# Patient Record
Sex: Male | Born: 1940 | Race: Black or African American | Hispanic: No | Marital: Single | Smoking: Former smoker
Health system: Southern US, Community
[De-identification: ages and names within clinical notes are randomized; demographics above are authoritative.]

## PROBLEM LIST (undated history)

## (undated) DIAGNOSIS — G629 Polyneuropathy, unspecified: Secondary | ICD-10-CM

## (undated) DIAGNOSIS — E119 Type 2 diabetes mellitus without complications: Secondary | ICD-10-CM

## (undated) DIAGNOSIS — K219 Gastro-esophageal reflux disease without esophagitis: Secondary | ICD-10-CM

## (undated) HISTORY — DX: Polyneuropathy, unspecified: G62.9

## (undated) HISTORY — DX: Type 2 diabetes mellitus without complications: E11.9

## (undated) HISTORY — DX: Gastro-esophageal reflux disease without esophagitis: K21.9

---

## 1998-11-21 ENCOUNTER — Emergency Department (HOSPITAL_COMMUNITY): Admission: EM | Admit: 1998-11-21 | Discharge: 1998-11-21 | Payer: Self-pay | Admitting: Emergency Medicine

## 2002-12-14 ENCOUNTER — Encounter: Payer: Self-pay | Admitting: Occupational Medicine

## 2002-12-14 ENCOUNTER — Encounter: Admission: RE | Admit: 2002-12-14 | Discharge: 2002-12-14 | Payer: Self-pay | Admitting: Occupational Medicine

## 2005-02-22 ENCOUNTER — Emergency Department (HOSPITAL_COMMUNITY): Admission: EM | Admit: 2005-02-22 | Discharge: 2005-02-22 | Payer: Self-pay | Admitting: Emergency Medicine

## 2005-09-28 ENCOUNTER — Ambulatory Visit: Payer: Self-pay | Admitting: Family Medicine

## 2005-10-19 ENCOUNTER — Ambulatory Visit: Payer: Self-pay | Admitting: Family Medicine

## 2005-11-12 ENCOUNTER — Ambulatory Visit: Payer: Self-pay | Admitting: Family Medicine

## 2005-12-07 ENCOUNTER — Ambulatory Visit: Payer: Self-pay | Admitting: Family Medicine

## 2005-12-15 ENCOUNTER — Ambulatory Visit: Payer: Self-pay | Admitting: Internal Medicine

## 2006-01-13 ENCOUNTER — Ambulatory Visit: Payer: Self-pay | Admitting: Internal Medicine

## 2007-09-05 DIAGNOSIS — K219 Gastro-esophageal reflux disease without esophagitis: Secondary | ICD-10-CM | POA: Insufficient documentation

## 2007-09-05 DIAGNOSIS — Z8619 Personal history of other infectious and parasitic diseases: Secondary | ICD-10-CM

## 2009-01-23 ENCOUNTER — Ambulatory Visit (HOSPITAL_COMMUNITY): Admission: EM | Admit: 2009-01-23 | Discharge: 2009-01-23 | Payer: Self-pay | Admitting: Emergency Medicine

## 2009-01-23 ENCOUNTER — Ambulatory Visit: Payer: Self-pay | Admitting: Internal Medicine

## 2009-01-25 ENCOUNTER — Telehealth: Payer: Self-pay | Admitting: Internal Medicine

## 2009-01-30 ENCOUNTER — Ambulatory Visit: Payer: Self-pay | Admitting: Internal Medicine

## 2009-02-15 ENCOUNTER — Encounter: Payer: Self-pay | Admitting: Internal Medicine

## 2009-02-15 ENCOUNTER — Ambulatory Visit (HOSPITAL_COMMUNITY): Admission: RE | Admit: 2009-02-15 | Discharge: 2009-02-15 | Payer: Self-pay | Admitting: Internal Medicine

## 2010-07-26 ENCOUNTER — Emergency Department (HOSPITAL_COMMUNITY): Admission: EM | Admit: 2010-07-26 | Discharge: 2010-07-26 | Payer: Self-pay | Admitting: Emergency Medicine

## 2016-03-31 ENCOUNTER — Encounter: Payer: Self-pay | Admitting: Internal Medicine

## 2016-12-31 ENCOUNTER — Emergency Department (HOSPITAL_COMMUNITY)
Admission: EM | Admit: 2016-12-31 | Discharge: 2016-12-31 | Disposition: A | Discharge status: Home / Self Care | Payer: Medicare PPO | Attending: Emergency Medicine | Admitting: Emergency Medicine

## 2016-12-31 ENCOUNTER — Encounter (HOSPITAL_COMMUNITY): Payer: Self-pay | Admitting: Emergency Medicine

## 2016-12-31 ENCOUNTER — Emergency Department (HOSPITAL_COMMUNITY): Payer: Medicare PPO

## 2016-12-31 DIAGNOSIS — Z87891 Personal history of nicotine dependence: Secondary | ICD-10-CM | POA: Diagnosis not present

## 2016-12-31 DIAGNOSIS — R55 Syncope and collapse: Secondary | ICD-10-CM | POA: Insufficient documentation

## 2016-12-31 DIAGNOSIS — R739 Hyperglycemia, unspecified: Secondary | ICD-10-CM | POA: Insufficient documentation

## 2016-12-31 LAB — CBC
HEMATOCRIT: 42.6 % (ref 39.0–52.0)
HEMOGLOBIN: 14.7 g/dL (ref 13.0–17.0)
MCH: 28.3 pg (ref 26.0–34.0)
MCHC: 34.5 g/dL (ref 30.0–36.0)
MCV: 81.9 fL (ref 78.0–100.0)
Platelets: 231 10*3/uL (ref 150–400)
RBC: 5.2 MIL/uL (ref 4.22–5.81)
RDW: 13.4 % (ref 11.5–15.5)
WBC: 3.1 10*3/uL — ABNORMAL LOW (ref 4.0–10.5)

## 2016-12-31 LAB — BASIC METABOLIC PANEL
ANION GAP: 17 — AB (ref 5–15)
BUN: 7 mg/dL (ref 6–20)
CHLORIDE: 92 mmol/L — AB (ref 101–111)
CO2: 25 mmol/L (ref 22–32)
Calcium: 8.8 mg/dL — ABNORMAL LOW (ref 8.9–10.3)
Creatinine, Ser: 0.86 mg/dL (ref 0.61–1.24)
GFR calc non Af Amer: >60 mL/min (ref >60)
Glucose, Bld: 273 mg/dL — ABNORMAL HIGH (ref 65–99)
POTASSIUM: 2.7 mmol/L — AB (ref 3.5–5.1)
SODIUM: 134 mmol/L — AB (ref 135–145)

## 2016-12-31 LAB — MAGNESIUM: Magnesium: 1.8 mg/dL (ref 1.7–2.4)

## 2016-12-31 MED ORDER — LACTATED RINGERS IV BOLUS (SEPSIS)
1000.0000 mL | Freq: Once | INTRAVENOUS | Status: AC
  Administered 2016-12-31: 1000 mL via INTRAVENOUS

## 2016-12-31 MED ORDER — POTASSIUM CHLORIDE 20 MEQ/15ML (10%) PO SOLN
40.0000 meq | Freq: Once | ORAL | Status: AC
  Administered 2016-12-31: 40 meq via ORAL
  Filled 2016-12-31: qty 30

## 2016-12-31 MED ORDER — POTASSIUM CHLORIDE CRYS ER 20 MEQ PO TBCR: 40.0000 meq | EXTENDED_RELEASE_TABLET | Freq: Once | ORAL | Status: DC

## 2016-12-31 NOTE — ED Notes (Signed)
Patient transported to X-ray 

## 2016-12-31 NOTE — ED Triage Notes (Signed)
Pt reports near syncopal episode 2 days ago while in the restroom, pt states his urine has also been dark for the past few days, reports he is worried he may be diabetic. Pt denies fever/chills, pain. A/ox4 resp e/u, skin warm and dry, nad.

## 2016-12-31 NOTE — ED Provider Notes (Signed)
[facility]-EMERGENCY DEPT Provider Note   CSN: [id number] Arrival date & time: 12/31/16  1238     History   Chief Complaint Chief Complaint  Patient presents with  . Near Syncope    HPI Jesus Arnold is a 76 y.o. male.  The history is provided by the patient. No language interpreter was used.  Near Syncope    Jesus Arnold is a 76 y.o. male who presents to the Emergency Department complaining of "I want to see if a diabetic". 2 days ago when he was in the bathroom washing he began to feel shaky and lightheaded and fell to the floor. He had no headache, chest pain, shortness of breath, diaphoresis, abdominal pain. He did not injure himself and did not pass out. He comes in today to see if he is diabetic. He has been feeling well since the near syncopal episode.  He has no family doctor and does not get routine medical care. He states sometimes when he drinks fluids he feels full earlier. No weight changes. He is a nonsmoker, no alcohol use, no drug use. History reviewed. No pertinent past medical history.  Patient Active Problem List   Diagnosis Date Noted  . GERD 09/05/2007  . HEPATITIS B, HX OF 09/05/2007    History reviewed. No pertinent surgical history.     Home Medications    Prior to Admission medications   Medication Sig Start Date End Date Taking? Authorizing Provider  Multiple Vitamin (MULTIVITAMIN) tablet Take 1 tablet by mouth daily.   Yes Historical Provider, MD    Family History No family history on file.  Social History Social History  Substance Use Topics  . Smoking status: Former Games developer  . Smokeless tobacco: Not on file  . Alcohol use No     Allergies   Patient has no known allergies.   Review of Systems Review of Systems  Cardiovascular: Positive for near-syncope.  All other systems reviewed and are negative.    Physical Exam Updated Vital Signs BP 136/88 (BP Location: Right Arm)   Pulse 80   Temp 98.2 F (36.8 C) (Oral)   Resp 15    Ht 5\' 6"  (1.676 m)   Wt 180 lb (81.6 kg)   SpO2 96%   BMI 29.05 kg/m   Physical Exam  Constitutional: He is oriented to person, place, and time. He appears well-developed and well-nourished.  HENT:  Head: Normocephalic and atraumatic.  Cardiovascular: Normal rate and regular rhythm.   No murmur heard. Pulmonary/Chest: Effort normal. No respiratory distress.  Occasional fine crackles  Abdominal: Soft. There is no tenderness. There is no rebound and no guarding.  Musculoskeletal: He exhibits no edema or tenderness.  Neurological: He is alert and oriented to person, place, and time.  Skin: Skin is warm and dry.  Psychiatric: He has a normal mood and affect. His behavior is normal.  Nursing note and vitals reviewed.    ED Treatments / Results  Labs (all labs ordered are listed, but only abnormal results are displayed) Labs Reviewed  BASIC METABOLIC PANEL - Abnormal; Notable for the following:       Result Value   Sodium 134 (*)    Potassium 2.7 (*)    Chloride 92 (*)    Glucose, Bld 273 (*)    Calcium 8.8 (*)    Anion gap 17 (*)    All other components within normal limits  CBC - Abnormal; Notable for the following:    WBC 3.1 (*)  All other components within normal limits  MAGNESIUM    EKG  EKG Interpretation  Date/Time:  Thursday December 31 2016 15:18:58 EST Ventricular Rate:  78 PR Interval:  144 QRS Duration: 91 QT Interval:  412 QTC Calculation: 470 R Axis:   -20 Text Interpretation:  Sinus rhythm Borderline left axis deviation Low voltage, precordial leads Consider anterior infarct Confirmed by Lincoln Brighamees, Liz [phone]([id number]) on 12/31/2016 3:26:53 PM       Radiology Dg Chest 2 View  Result Date: 12/31/2016 CLINICAL DATA:  Physical examination. EXAM: CHEST  2 VIEW COMPARISON:  01/23/2009 FINDINGS: The heart is normal in size. There is moderate tortuosity of the thoracic aorta. Slightly prominent pulmonary arteries. Chronic bronchitic type interstitial changes likely  related to smoking. No infiltrates, edema or effusions. No worrisome pulmonary lesions. The bony thorax is intact. IMPRESSION: Chronic bronchitic type lung changes but no acute pulmonary findings. Electronically Signed   By: Rudie MeyerP.  Gallerani M.D.   On: 12/31/2016 16:57    Procedures Procedures (including critical care time)  Medications Ordered in ED Medications  lactated ringers bolus 1,000 mL (0 mLs Intravenous Stopped 12/31/16 1623)  potassium chloride 20 MEQ/15ML (10%) solution 40 mEq (40 mEq Oral Given 12/31/16 1531)     Initial Impression / Assessment and Plan / ED Course  I have reviewed the triage vital signs and the nursing notes.  Pertinent labs & imaging results that were available during my care of the patient were reviewed by me and considered in my medical decision making (see chart for details).   Pt here following a near syncopal episode 2 days ago, currently asymptomatic in the emergency department. He is noted to be mildly hyperglycemic with hyponatremia. He was provided IV fluids and potassium replacement. Concern for developing diabetes. Discussed with patient findings and concerns. Discussed diabetic diet. Also discussed establishing PCP for close follow-up and recheck.  Clinical picture is not consistent with PE, cardiogenic syncope.   Final Clinical Impressions(s) / ED Diagnoses   Final diagnoses:  Near syncope  Hyperglycemia    New Prescriptions Discharge Medication List as of 12/31/2016  6:04 PM       Tilden FossaElizabeth Angeletta Goelz, MD 01/01/17 [id number]

## 2017-01-28 ENCOUNTER — Other Ambulatory Visit (INDEPENDENT_AMBULATORY_CARE_PROVIDER_SITE_OTHER): Payer: Medicare PPO

## 2017-01-28 ENCOUNTER — Ambulatory Visit (INDEPENDENT_AMBULATORY_CARE_PROVIDER_SITE_OTHER): Payer: Medicare PPO | Admitting: Family

## 2017-01-28 ENCOUNTER — Encounter: Payer: Self-pay | Admitting: Family

## 2017-01-28 VITALS — BP 112/80 | HR 84 | Temp 98.1°F | Resp 16 | Ht 66.0 in | Wt 181.0 lb

## 2017-01-28 DIAGNOSIS — E876 Hypokalemia: Secondary | ICD-10-CM | POA: Diagnosis not present

## 2017-01-28 DIAGNOSIS — R739 Hyperglycemia, unspecified: Secondary | ICD-10-CM | POA: Insufficient documentation

## 2017-01-28 DIAGNOSIS — L309 Dermatitis, unspecified: Secondary | ICD-10-CM | POA: Diagnosis not present

## 2017-01-28 LAB — LIPID PANEL
CHOL/HDL RATIO: 5
Cholesterol: 162 mg/dL (ref 0–200)
HDL: 35.1 mg/dL — AB (ref >39.00)
LDL CALC: 108 mg/dL — AB (ref 0–99)
NonHDL: 127.35
TRIGLYCERIDES: 97 mg/dL (ref 0.0–149.0)
VLDL: 19.4 mg/dL (ref 0.0–40.0)

## 2017-01-28 LAB — COMPREHENSIVE METABOLIC PANEL
ALT: 13 U/L (ref 0–53)
AST: 25 U/L (ref 0–37)
Albumin: 3.9 g/dL (ref 3.5–5.2)
Alkaline Phosphatase: 65 U/L (ref 39–117)
BUN: 7 mg/dL (ref 6–23)
CALCIUM: 9.2 mg/dL (ref 8.4–10.5)
CHLORIDE: 103 meq/L (ref 96–112)
CO2: 28 meq/L (ref 19–32)
Creatinine, Ser: 1.01 mg/dL (ref 0.40–1.50)
GFR: 92.97 mL/min (ref >60.00)
GLUCOSE: 102 mg/dL — AB (ref 70–99)
Potassium: 3.7 mEq/L (ref 3.5–5.1)
Sodium: 139 mEq/L (ref 135–145)
Total Bilirubin: 0.5 mg/dL (ref 0.2–1.2)
Total Protein: 7.8 g/dL (ref 6.0–8.3)

## 2017-01-28 LAB — HEMOGLOBIN A1C: HEMOGLOBIN A1C: 9.7 % — AB (ref 4.6–6.5)

## 2017-01-28 MED ORDER — BETAMETHASONE VALERATE 0.1 % EX CREA: TOPICAL_CREAM | Freq: Two times a day (BID) | CUTANEOUS | 0 refills | Status: DC

## 2017-01-28 NOTE — Assessment & Plan Note (Signed)
Repleated in the ED with no current symptoms. Obtain CMET. Continue to monitor pending blood work.

## 2017-01-28 NOTE — Patient Instructions (Addendum)
Thank you for choosing [facility].  SUMMARY AND INSTRUCTIONS:  Continue to moisturize with Eucerin, Dove or Aveeno products.   Start the betamethasone cream as needed.  We will call with the results of your blood work.  Medication:  Your prescription(s) have been submitted to your pharmacy or been printed and provided for you. Please take as directed and contact our office if you believe you are having problem(s) with the medication(s) or have any questions.  Labs:  Please stop by the lab on the lower level of the building for your blood work. Your results will be released to MyChart (or called to you) after review, usually within 72 hours after test completion. If any changes need to be made, you will be notified at that same time.  1.) The lab is open from 7:30am to 5:30 pm Monday-Friday 2.) No appointment is necessary 3.) Fasting (if needed) is 6-8 hours after food and drink; black coffee and water are okay   Follow up:  If your symptoms worsen or fail to improve, please contact our office for further instruction, or in case of emergency go directly to the emergency room at the closest medical facility.   Eczema Eczema, also called atopic dermatitis, is a skin disorder that causes inflammation of the skin. It causes a red rash and dry, scaly skin. The skin becomes very itchy. Eczema is generally worse during the cooler winter months and often improves with the warmth of summer. Eczema usually starts showing signs in infancy. Some children outgrow eczema, but it may last through adulthood. What are the causes? The exact cause of eczema is not known, but it appears to run in families. People with eczema often have a family history of eczema, allergies, asthma, or hay fever. Eczema is not contagious. Flare-ups of the condition may be caused by:  Contact with something you are sensitive or allergic to.  Stress. What are the signs or symptoms?  Dry, scaly skin.  Red,  itchy rash.  Itchiness. This may occur before the skin rash and may be very intense. How is this diagnosed? The diagnosis of eczema is usually made based on symptoms and medical history. How is this treated? Eczema cannot be cured, but symptoms usually can be controlled with treatment and other strategies. A treatment plan might include:  Controlling the itching and scratching.  Use over-the-counter antihistamines as directed for itching. This is especially useful at night when the itching tends to be worse.  Use over-the-counter steroid creams as directed for itching.  Avoid scratching. Scratching makes the rash and itching worse. It may also result in a skin infection (impetigo) due to a break in the skin caused by scratching.  Keeping the skin well moisturized with creams every day. This will seal in moisture and help prevent dryness. Lotions that contain alcohol and water should be avoided because they can dry the skin.  Limiting exposure to things that you are sensitive or allergic to (allergens).  Recognizing situations that cause stress.  Developing a plan to manage stress. Follow these instructions at home:  Only take over-the-counter or prescription medicines as directed by your health care provider.  Do not use anything on the skin without checking with your health care provider.  Keep baths or showers short (5 minutes) in warm (not hot) water. Use mild cleansers for bathing. These should be unscented. You may add nonperfumed bath oil to the bath water. It is best to avoid soap and bubble bath.  Immediately after  a bath or shower, when the skin is still damp, apply a moisturizing ointment to the entire body. This ointment should be a petroleum ointment. This will seal in moisture and help prevent dryness. The thicker the ointment, the better. These should be unscented.  Keep fingernails cut short. Children with eczema may need to wear soft gloves or mittens at night after  applying an ointment.  Dress in clothes made of cotton or cotton blends. Dress lightly, because heat increases itching.  A child with eczema should stay away from anyone with fever blisters or cold sores. The virus that causes fever blisters (herpes simplex) can cause a serious skin infection in children with eczema. Contact a health care provider if:  Your itching interferes with sleep.  Your rash gets worse or is not better within 1 week after starting treatment.  You see pus or soft yellow scabs in the rash area.  You have a fever.  You have a rash flare-up after contact with someone who has fever blisters. This information is not intended to replace advice given to you by your health care provider. Make sure you discuss any questions you have with your health care provider. Document Released: 11/13/2000 Document Revised: 04/23/2016 Document Reviewed: 06/19/2013 Elsevier Interactive Patient Education  2017 [employer].

## 2017-01-28 NOTE — Assessment & Plan Note (Signed)
Hyperglycemia with polyphagia and polydipsia with concern for Type 2 diabetes. Obtain Hemoglobin A1c. Follow up and additional treatment pending A1c results.

## 2017-01-28 NOTE — Assessment & Plan Note (Signed)
Symptoms and exam consistent with eczema that remains uncontrolled with OTC regimen. Start betamethasone. Continue with Lennart Pallove, Aveeno and Eucerin products for skin hydration. Follow up if symptoms worsen or do not improve.

## 2017-01-28 NOTE — Progress Notes (Signed)
Subjective:    Patient ID: Jesus Arnold, male    DOB: [birth year], 76 y.o.   MRN: [id number]  Chief Complaint  Patient presents with  . Establish Care    wants to check for diabetes    HPI:  Kevin Space is a 76 y.o. male who  has a past medical history of GERD (gastroesophageal reflux disease). and presents today for an office visit to establish care.   1.) Hyperglycemia and Hyperkalemia - Recently evaluated in the emergency department with the chief complaint with concerns for diabetes as he describes 2 days prior to presentation that he was in the bathroom washing when he began to feel shaky and lightheaded and fell to the floor. There was no injury at the time. Blood work showed a sodium level CXXXIV, potassium of 2.7, glucose 273 and a white blood cell count of 3.1. EKG showed sinus rhythm with borderline left axis deviation. He was treated with IV fluids and potassium replacement with concern for developing diabetes. Advised to follow-up with primary care. All ED records and labs were reviewed in detail.   Since leaving the emergency department he reports no further episodes of dizziness or presyncope/syncope. He does experience increased hunger and thirst on occasion. Denies any additional fatigue or weakness.   2.) Eczmea - Previously diagnosed with eczema and continues to experience the associated symptom of red, dry itchy rashes located on his face and extremities.This has been going on for several years. Current modifying factors include Eucerin cream which seems to help his symptoms. Denies any spreading. Aggravated with dry conditions.     No Known Allergies    Outpatient Medications Prior to Visit  Medication Sig Dispense Refill  . Multiple Vitamin (MULTIVITAMIN) tablet Take 1 tablet by mouth daily.     No facility-administered medications prior to visit.      Past Medical History:  Diagnosis Date  . GERD (gastroesophageal reflux disease)       History  reviewed. No pertinent surgical history.    History reviewed. No pertinent family history.    Social History   Social History  . Marital status: Single    Spouse name: N/A  . Number of children: 2  . Years of education: 10   Occupational History  . Retired    Social History Main Topics  . Smoking status: Former Smoker    Quit date: 01/28/2017  . Smokeless tobacco: Never Used  . Alcohol use No  . Drug use: No  . Sexual activity: Not on file   Other Topics Concern  . Not on file   Social History Narrative   Fun/Hobby: Relax; Watch tv, walking.       Review of Systems  Constitutional: Negative for activity change, chills, fatigue, fever and unexpected weight change.  Respiratory: Negative for chest tightness and shortness of breath.   Cardiovascular: Negative for chest pain, palpitations and leg swelling.  Endocrine: Positive for polydipsia and polyphagia. Negative for cold intolerance and heat intolerance.  Neurological: Negative for dizziness, tremors, seizures, syncope, weakness, light-headedness and numbness.       Objective:    BP 112/80 (BP Location: Left Arm, Patient Position: Sitting, Cuff Size: Normal)   Pulse 84   Temp 98.1 F (36.7 C) (Oral)   Resp 16   Ht 5' 6" (1.676 m)   Wt 181 lb (82.1 kg)   SpO2 93%   BMI 29.21 kg/m  Nursing note and vital signs reviewed.  Physical Exam  Constitutional: He is  oriented to person, place, and time. He appears well-developed and well-nourished. No distress.  Cardiovascular: Normal rate, regular rhythm, normal heart sounds and intact distal pulses.   Pulmonary/Chest: Effort normal and breath sounds normal.  Neurological: He is alert and oriented to person, place, and time.  Skin: Skin is warm and dry.  Red/pinkish patches consistent with eczema located on his face and extremities.   Psychiatric: He has a normal mood and affect. His behavior is normal. Judgment and thought content normal.        Assessment &  Plan:   Problem List Items Addressed This Visit      Musculoskeletal and Integument   Eczema    Symptoms and exam consistent with eczema that remains uncontrolled with OTC regimen. Start betamethasone. Continue with Roylene Reason and Eucerin products for skin hydration. Follow up if symptoms worsen or do not improve.         Other   Hyperglycemia - Primary    Hyperglycemia with polyphagia and polydipsia with concern for Type 2 diabetes. Obtain Hemoglobin A1c. Follow up and additional treatment pending A1c results.       Relevant Orders   Hemoglobin A1c (Completed)   Comp Met (CMET) (Completed)   Lipid Profile (Completed)   Hypokalemia    Repleated in the ED with no current symptoms. Obtain CMET. Continue to monitor pending blood work.           I am having Mr. Ferber start on betamethasone valerate. I am also having him maintain his multivitamin.   Meds ordered this encounter  Medications  . betamethasone valerate (VALISONE) 0.1 % cream    Sig: Apply topically 2 (two) times daily.    Dispense:  30 g    Refill:  0    Order Specific Question:   Supervising Provider    Answer:   Pricilla Holm A [[id number]]     Follow-up: Return in about 3 weeks (around 02/18/2017), or if symptoms worsen or fail to improve.  Mauricio Po, FNP

## 2017-02-01 ENCOUNTER — Other Ambulatory Visit: Payer: Self-pay | Admitting: Family

## 2017-02-01 DIAGNOSIS — E119 Type 2 diabetes mellitus without complications: Secondary | ICD-10-CM | POA: Insufficient documentation

## 2017-02-01 MED ORDER — ROSUVASTATIN CALCIUM 10 MG PO TABS: 10.0000 mg | ORAL_TABLET | Freq: Every day | ORAL | 2 refills | Status: DC

## 2017-02-01 MED ORDER — METFORMIN HCL ER 500 MG PO TB24: 500.0000 mg | ORAL_TABLET | Freq: Every day | ORAL | 2 refills | Status: DC

## 2017-02-03 ENCOUNTER — Telehealth: Payer: Self-pay | Admitting: Family

## 2017-02-03 NOTE — Telephone Encounter (Signed)
Patient called back about his lab results. I did not feel I should be the one to tell him that information. Please follow up with him once you have time. Thank you.

## 2017-02-03 NOTE — Telephone Encounter (Signed)
Pt aware of results 

## 2017-03-05 ENCOUNTER — Encounter: Payer: Self-pay | Admitting: Family

## 2017-03-05 ENCOUNTER — Ambulatory Visit (INDEPENDENT_AMBULATORY_CARE_PROVIDER_SITE_OTHER): Payer: Medicare PPO | Admitting: Family

## 2017-03-05 VITALS — BP 120/82 | HR 85 | Temp 98.2°F | Resp 16 | Ht 66.0 in | Wt 173.0 lb

## 2017-03-05 DIAGNOSIS — E785 Hyperlipidemia, unspecified: Secondary | ICD-10-CM | POA: Diagnosis not present

## 2017-03-05 DIAGNOSIS — E119 Type 2 diabetes mellitus without complications: Secondary | ICD-10-CM

## 2017-03-05 MED ORDER — BETAMETHASONE VALERATE 0.1 % EX CREA: TOPICAL_CREAM | Freq: Two times a day (BID) | CUTANEOUS | 1 refills | Status: DC

## 2017-03-05 MED ORDER — ROSUVASTATIN CALCIUM 10 MG PO TABS: 10.0000 mg | ORAL_TABLET | Freq: Every day | ORAL | 2 refills | Status: DC

## 2017-03-05 MED ORDER — GLUCOSE BLOOD VI STRP: ORAL_STRIP | 12 refills | Status: DC

## 2017-03-05 MED ORDER — METFORMIN HCL ER 500 MG PO TB24: 500.0000 mg | ORAL_TABLET | Freq: Every day | ORAL | 2 refills | Status: DC

## 2017-03-05 MED ORDER — ONETOUCH ULTRA MINI W/DEVICE KIT: PACK | 0 refills | Status: DC

## 2017-03-05 MED ORDER — ONETOUCH SURESOFT LANCING DEV MISC: 1 refills | Status: DC

## 2017-03-05 NOTE — Assessment & Plan Note (Signed)
Newly diagnosed with Type 2 diabetes that is uncontrolled and started on metformin. No adverse side effects of medication. Diabetic foot exam completed today. Encouraged to complete diabetic eye exam independently. Blood glucose monitoring supplies sent to pharmacy with recommendation for checking blood sugars 2-3 times per week as this is what patient is agreeable to. Follow up in 2 months or sooner if needed.

## 2017-03-05 NOTE — Progress Notes (Signed)
Subjective:    Patient ID: Jesus Arnold, male    DOB: May 17, 1941, 76 y.o.   MRN: [id number]  Chief Complaint  Patient presents with  . Follow-up    diabetes    HPI:  Jesus Arnold is a 76 y.o. male who  has a past medical history of GERD (gastroesophageal reflux disease). and presents today for a follow up office visit.  1.) Type 2 diabetes - Reports taking the medication as prescribed and denies adverse side effects or hypoglycemic symptoms. Indicates he does feel better from previous with decreased polyphagia and polydipsia. Does not currently check blood sugars at home.  Denies new symptoms of end organ damage. Working on a low/carbohydrate modified oral intake and increasing his fruit and vegetable intake.   Lab Results  Component Value Date   HGBA1C 9.7 (H) 01/28/2017     Lab Results  Component Value Date   CREATININE 1.01 01/28/2017   BUN 7 01/28/2017   NA 139 01/28/2017   K 3.7 01/28/2017   CL 103 01/28/2017   CO2 28 01/28/2017   2.) Hyperlipidemia - Previously noted to have increased risk for coronary artery event in the next ten years and started on Crestor. Reports taking the medications as prescribed and denies adverse side effects or myalgias. Working on improving his nutritional intake.   Lab Results  Component Value Date   CHOL 162 01/28/2017   HDL 35.10 (L) 01/28/2017   LDLCALC 108 (H) 01/28/2017   TRIG 97.0 01/28/2017   CHOLHDL 5 01/28/2017      No Known Allergies    Outpatient Medications Prior to Visit  Medication Sig Dispense Refill  . Multiple Vitamin (MULTIVITAMIN) tablet Take 1 tablet by mouth daily.    . betamethasone valerate (VALISONE) 0.1 % cream Apply topically 2 (two) times daily. 30 g 0  . metFORMIN (GLUCOPHAGE XR) 500 MG 24 hr tablet Take 1 tablet (500 mg total) by mouth daily with breakfast. 30 tablet 2  . rosuvastatin (CRESTOR) 10 MG tablet Take 1 tablet (10 mg total) by mouth daily. 30 tablet 2   No facility-administered  medications prior to visit.     Review of Systems  Eyes:       Negative for changes in vision.  Respiratory: Negative for chest tightness and shortness of breath.   Cardiovascular: Negative for chest pain, palpitations and leg swelling.  Endocrine: Negative for polydipsia, polyphagia and polyuria.  Neurological: Negative for dizziness, weakness, light-headedness and headaches.      Objective:    BP 120/82 (BP Location: Left Arm, Patient Position: Sitting, Cuff Size: Normal)   Pulse 85   Temp 98.2 F (36.8 C) (Oral)   Resp 16   Ht 5' 6"  (1.676 m)   Wt 173 lb (78.5 kg)   SpO2 95%   BMI 27.92 kg/m  Nursing note and vital signs reviewed.  Physical Exam  Constitutional: He is oriented to person, place, and time. He appears well-developed and well-nourished. No distress.  Cardiovascular: Normal rate, regular rhythm, normal heart sounds and intact distal pulses.   Pulmonary/Chest: Effort normal and breath sounds normal.  Neurological: He is alert and oriented to person, place, and time.  Diabetic Foot Exam - Simple   Simple Foot Form Visual Inspection No deformities, no ulcerations, no other skin breakdown bilaterally:  Yes Sensation Testing See comments:  Yes Pulse Check Posterior Tibialis and Dorsalis pulse intact bilaterally:  Yes Comments Several areas of decreased sensation distally to monofilament and tuning  fork.  Skin: Skin is warm and dry.  Psychiatric: He has a normal mood and affect. His behavior is normal. Judgment and thought content normal.       Assessment & Plan:   Problem List Items Addressed This Visit      Endocrine   Type 2 diabetes mellitus ([place]) - Primary    Newly diagnosed with Type 2 diabetes that is uncontrolled and started on metformin. No adverse side effects of medication. Diabetic foot exam completed today. Encouraged to complete diabetic eye exam independently. Blood glucose monitoring supplies sent to pharmacy with recommendation for  checking blood sugars 2-3 times per week as this is what patient is agreeable to. Follow up in 2 months or sooner if needed.       Relevant Medications   Blood Glucose Monitoring Suppl (ONE TOUCH ULTRA MINI) w/Device KIT   Lancets Misc. (ONE TOUCH SURESOFT) MISC   glucose blood (ONE TOUCH ULTRA TEST) test strip   metFORMIN (GLUCOPHAGE XR) 500 MG 24 hr tablet   rosuvastatin (CRESTOR) 10 MG tablet     Other   Dyslipidemia    Dyslipidemia started on Crestor. No adverse side effects. Continue current dosage of Crestor. Recheck lipid profile in 2 month.       Relevant Medications   rosuvastatin (CRESTOR) 10 MG tablet       I am having Jesus Arnold start on ONE TOUCH ULTRA MINI, ONE TOUCH SURESOFT, and glucose blood. I am also having him maintain his multivitamin, betamethasone valerate, metFORMIN, and rosuvastatin.   Meds ordered this encounter  Medications  . Blood Glucose Monitoring Suppl (ONE TOUCH ULTRA MINI) w/Device KIT    Sig: Use meter to check blood sugars 1-4 times daily as instructed.    Dispense:  1 each    Refill:  0    Substitution permissible per insurance coverage. Dx E11.9.    Order Specific Question:   Supervising Provider    Answer:   Pricilla Holm A [[id number]]  . Lancets Misc. (ONE TOUCH SURESOFT) MISC    Sig: Use 1 lancet per test. Test blood sugars 1-4 times per day as instructed.    Dispense:  1 each    Refill:  1    Substitution permissible per insurance coverage. Dx E11.9.    Order Specific Question:   Supervising Provider    Answer:   Pricilla Holm A [[id number]]  . glucose blood (ONE TOUCH ULTRA TEST) test strip    Sig: Use one strip per test. Test blood sugars 1-4 times daily as instructed.    Dispense:  100 each    Refill:  12    Substitution permissible per insurance coverage. Dx E11.9.    Order Specific Question:   Supervising Provider    Answer:   Pricilla Holm A [[id number]]  . betamethasone valerate (VALISONE) 0.1 % cream    Sig: Apply  topically 2 (two) times daily.    Dispense:  30 g    Refill:  1    Order Specific Question:   Supervising Provider    Answer:   Pricilla Holm A [[id number]]  . metFORMIN (GLUCOPHAGE XR) 500 MG 24 hr tablet    Sig: Take 1 tablet (500 mg total) by mouth daily with breakfast.    Dispense:  30 tablet    Refill:  2    Order Specific Question:   Supervising Provider    Answer:   Pricilla Holm A [[id number]]  . rosuvastatin (CRESTOR) 10 MG tablet    Sig: Take  1 tablet (10 mg total) by mouth daily.    Dispense:  30 tablet    Refill:  2    Order Specific Question:   Supervising Provider    Answer:   Pricilla Holm A [[id number]]     Follow-up: Return if symptoms worsen or fail to improve.  Mauricio Po, FNP

## 2017-03-05 NOTE — Patient Instructions (Addendum)
Thank you for choosing [facility].  SUMMARY AND INSTRUCTIONS:  Please check blood sugars 2-3 times per week and record the numbers.  Continue to work on a low / modified carbohydrate diet.  Follow up in 2 months for A1c check.  Complete diabetic eye exam.   Goal blood sugars are 80-130.   Check feet daily.   Medication:  Your prescription(s) have been submitted to your pharmacy or been printed and provided for you. Please take as directed and contact our office if you believe you are having problem(s) with the medication(s) or have any questions. establishing an appointment with the specialists we discussed.   Follow up:  If your symptoms worsen or fail to improve, please contact our office for further instruction, or in case of emergency go directly to the emergency room at the closest medical facility.    DIABETES CARE INSTRUCTIONS:  Current A1c:  Lab Results  Component Value Date   HGBA1C 9.7 (H) 01/28/2017    A1C Goal: <7.0%  Fasting Blood Sugar Goal: 80-130 Blood sugar check that you take after fasting for at least 8 hours which is usually in the morning before eating.  Post-prandial Blood Sugar Goal: <180 Blood sugar check approximately 1-2 hours after the start of a meal.    Diabetes Prevention Screens:  1.) Ensure you have an annual eye exam by an ophthalmologist or optometrist.   2,) Ensure you have an annual foot exam or when any changes are noted including new onset numbness/tingling or wounds. Check your feet daily!  3.) The American Diabetes Association recommends the Pneumovax vaccination against pneumonia every 5 years.  4.) We will check your kidney function with a urine test on an annual basis.

## 2017-03-05 NOTE — Assessment & Plan Note (Addendum)
Dyslipidemia started on Crestor. No adverse side effects. Continue current dosage of Crestor. Recheck lipid profile in 2 month.

## 2017-03-11 ENCOUNTER — Other Ambulatory Visit: Payer: Self-pay

## 2017-03-11 MED ORDER — BLOOD GLUCOSE MONITOR KIT: PACK | 0 refills | Status: AC

## 2017-05-03 ENCOUNTER — Encounter: Payer: Self-pay | Admitting: Family

## 2017-05-03 LAB — HM DIABETES EYE EXAM

## 2017-07-24 IMAGING — CR DG CHEST 2V
2 series · 2 of 2 positions shown · non-contrast
Comparison: 01/23/2009

CLINICAL DATA: Physical examination.

EXAM:
CHEST  2 VIEW

[chest pa]
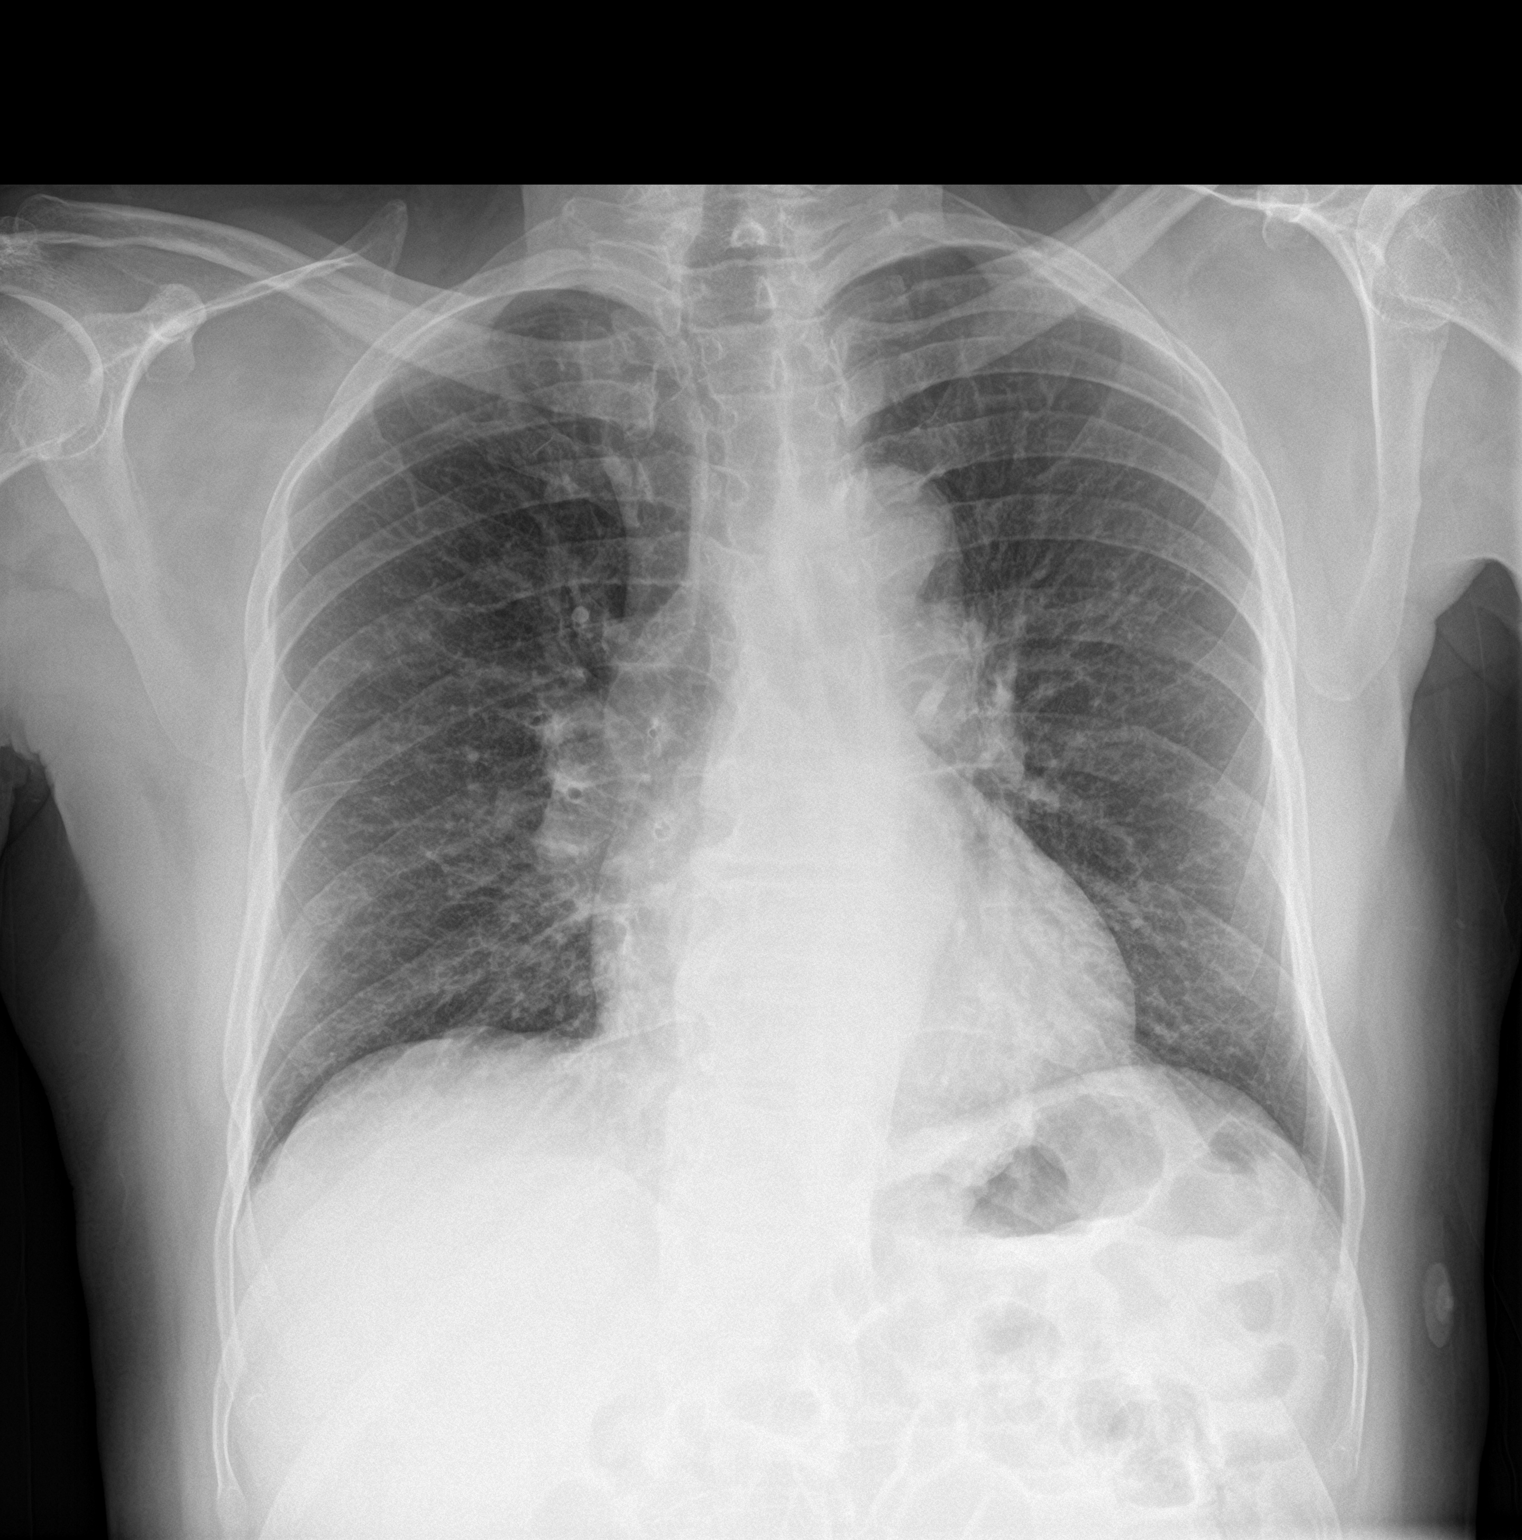

[chest lat]
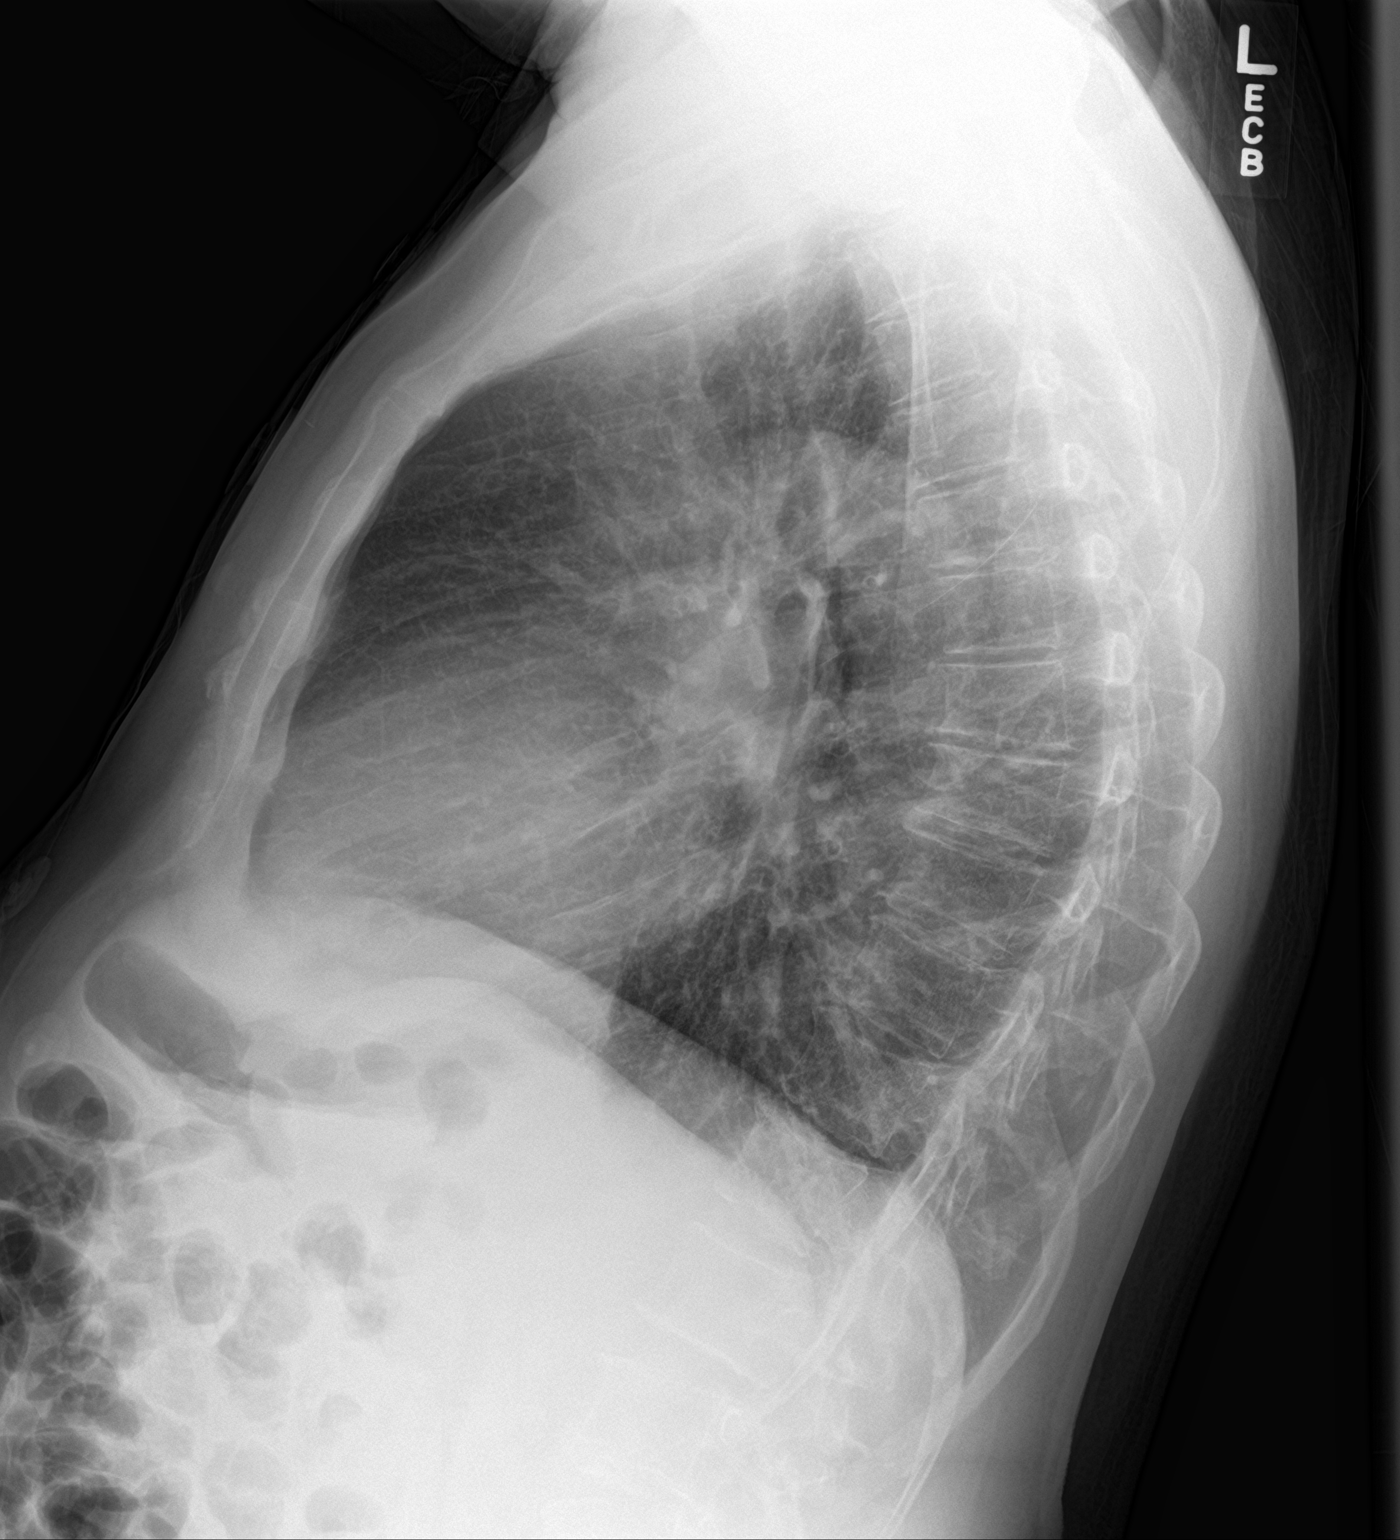

[2 of 2 positions shown; findings below may reference images not displayed]

FINDINGS: The heart is normal in size. There is moderate tortuosity of the
thoracic aorta. Slightly prominent pulmonary arteries. Chronic
bronchitic type interstitial changes likely related to smoking. No
infiltrates, edema or effusions. No worrisome pulmonary lesions. The
bony thorax is intact.
IMPRESSION: Chronic bronchitic type lung changes but no acute pulmonary
findings.

## 2017-10-29 ENCOUNTER — Telehealth: Payer: Self-pay | Admitting: Family

## 2017-10-29 NOTE — Telephone Encounter (Signed)
Previous Jesus Arnold pt: Pt would like to transfer to male provider.

## 2017-10-29 NOTE — Telephone Encounter (Signed)
LM for pt to schedule an appointment possibly with Dr Jonny RuizJohn.

## 2017-10-29 NOTE — Telephone Encounter (Signed)
Copied from CRM [phone]. Topic: Quick Communication - See Telephone Encounter >> Oct 29, 2017  3:31 PM Elliot GaultBell, Tiffany M wrote: Relation to pt: self  Call back [phone]   Reason for call:  [facility], patient would like to transfer to a male provider, please advise

## 2017-11-01 NOTE — Telephone Encounter (Signed)
Patient scheduled with Dr. Jonny RuizJohn for 11/09/2017 15minute slot, please advise if this time is appropriate?

## 2017-11-03 NOTE — Telephone Encounter (Signed)
No issues with this appointment. Thank you for scheduling!

## 2017-11-09 ENCOUNTER — Ambulatory Visit: Payer: Medicare PPO | Admitting: Internal Medicine

## 2017-11-17 ENCOUNTER — Other Ambulatory Visit (INDEPENDENT_AMBULATORY_CARE_PROVIDER_SITE_OTHER): Payer: Medicare PPO

## 2017-11-17 ENCOUNTER — Encounter: Payer: Self-pay | Admitting: Internal Medicine

## 2017-11-17 ENCOUNTER — Ambulatory Visit: Payer: Medicare PPO | Admitting: Internal Medicine

## 2017-11-17 VITALS — BP 110/78 | HR 74 | Temp 97.8°F | Ht 66.0 in | Wt 172.0 lb

## 2017-11-17 DIAGNOSIS — Z0000 Encounter for general adult medical examination without abnormal findings: Secondary | ICD-10-CM

## 2017-11-17 DIAGNOSIS — E785 Hyperlipidemia, unspecified: Secondary | ICD-10-CM | POA: Diagnosis not present

## 2017-11-17 DIAGNOSIS — Z0001 Encounter for general adult medical examination with abnormal findings: Secondary | ICD-10-CM | POA: Diagnosis not present

## 2017-11-17 DIAGNOSIS — G629 Polyneuropathy, unspecified: Secondary | ICD-10-CM

## 2017-11-17 DIAGNOSIS — Z23 Encounter for immunization: Secondary | ICD-10-CM

## 2017-11-17 DIAGNOSIS — E119 Type 2 diabetes mellitus without complications: Secondary | ICD-10-CM

## 2017-11-17 DIAGNOSIS — L309 Dermatitis, unspecified: Secondary | ICD-10-CM

## 2017-11-17 DIAGNOSIS — R21 Rash and other nonspecific skin eruption: Secondary | ICD-10-CM | POA: Insufficient documentation

## 2017-11-17 HISTORY — DX: Polyneuropathy, unspecified: G62.9

## 2017-11-17 LAB — HEPATIC FUNCTION PANEL
ALK PHOS: 67 U/L (ref 39–117)
ALT: 21 U/L (ref 0–53)
AST: 35 U/L (ref 0–37)
Albumin: 4.1 g/dL (ref 3.5–5.2)
BILIRUBIN DIRECT: 0.1 mg/dL (ref 0.0–0.3)
BILIRUBIN TOTAL: 0.7 mg/dL (ref 0.2–1.2)
TOTAL PROTEIN: 7.7 g/dL (ref 6.0–8.3)

## 2017-11-17 LAB — URINALYSIS, ROUTINE W REFLEX MICROSCOPIC
BILIRUBIN URINE: NEGATIVE
Hgb urine dipstick: NEGATIVE
KETONES UR: NEGATIVE
Leukocytes, UA: NEGATIVE
NITRITE: NEGATIVE
PH: 5.5 (ref 5.0–8.0)
SPECIFIC GRAVITY, URINE: 1.02 (ref 1.000–1.030)
Total Protein, Urine: NEGATIVE
Urine Glucose: NEGATIVE
Urobilinogen, UA: 0.2 (ref 0.0–1.0)

## 2017-11-17 LAB — CBC WITH DIFFERENTIAL/PLATELET
BASOS PCT: 1.1 % (ref 0.0–3.0)
Basophils Absolute: 0 10*3/uL (ref 0.0–0.1)
EOS ABS: 0.2 10*3/uL (ref 0.0–0.7)
EOS PCT: 4 % (ref 0.0–5.0)
HEMATOCRIT: 44.5 % (ref 39.0–52.0)
HEMOGLOBIN: 14.9 g/dL (ref 13.0–17.0)
LYMPHS PCT: 41.9 % (ref 12.0–46.0)
Lymphs Abs: 1.9 10*3/uL (ref 0.7–4.0)
MCHC: 33.5 g/dL (ref 30.0–36.0)
MCV: 88.4 fl (ref 78.0–100.0)
MONO ABS: 0.4 10*3/uL (ref 0.1–1.0)
Monocytes Relative: 9.6 % (ref 3.0–12.0)
Neutro Abs: 1.9 10*3/uL (ref 1.4–7.7)
Neutrophils Relative %: 43.4 % (ref 43.0–77.0)
Platelets: 245 10*3/uL (ref 150.0–400.0)
RBC: 5.04 Mil/uL (ref 4.22–5.81)
RDW: 14.1 % (ref 11.5–15.5)
WBC: 4.5 10*3/uL (ref 4.0–10.5)

## 2017-11-17 LAB — LIPID PANEL
CHOL/HDL RATIO: 4
Cholesterol: 165 mg/dL (ref 0–200)
HDL: 42.6 mg/dL (ref >39.00)
LDL CALC: 108 mg/dL — AB (ref 0–99)
NonHDL: 122.66
TRIGLYCERIDES: 71 mg/dL (ref 0.0–149.0)
VLDL: 14.2 mg/dL (ref 0.0–40.0)

## 2017-11-17 LAB — BASIC METABOLIC PANEL
BUN: 7 mg/dL (ref 6–23)
CO2: 29 mEq/L (ref 19–32)
Calcium: 9.2 mg/dL (ref 8.4–10.5)
Chloride: 102 mEq/L (ref 96–112)
Creatinine, Ser: 0.91 mg/dL (ref 0.40–1.50)
GFR: 104.62 mL/min (ref >60.00)
Glucose, Bld: 90 mg/dL (ref 70–99)
Potassium: 4.2 mEq/L (ref 3.5–5.1)
Sodium: 139 mEq/L (ref 135–145)

## 2017-11-17 LAB — MICROALBUMIN / CREATININE URINE RATIO
Creatinine,U: 139.4 mg/dL
Microalb Creat Ratio: 0.6 mg/g (ref 0.0–30.0)
Microalb, Ur: 0.9 mg/dL (ref 0.0–1.9)

## 2017-11-17 LAB — HEMOGLOBIN A1C: Hgb A1c MFr Bld: 7 % — ABNORMAL HIGH (ref 4.6–6.5)

## 2017-11-17 LAB — TSH: TSH: 0.95 u[IU]/mL (ref 0.35–4.50)

## 2017-11-17 LAB — PSA: PSA: 0.49 ng/mL (ref 0.10–4.00)

## 2017-11-17 MED ORDER — KETOCONAZOLE 2 % EX CREA: 1.0000 "application " | TOPICAL_CREAM | Freq: Every day | CUTANEOUS | 0 refills | Status: DC

## 2017-11-17 MED ORDER — ZOSTER VAC RECOMB ADJUVANTED 50 MCG/0.5ML IM SUSR: 0.5000 mL | Freq: Once | INTRAMUSCULAR | 1 refills | Status: AC

## 2017-11-17 MED ORDER — TRIAMCINOLONE ACETONIDE 0.1 % EX CREA: 1.0000 "application " | TOPICAL_CREAM | Freq: Two times a day (BID) | CUTANEOUS | 1 refills | Status: DC

## 2017-11-17 NOTE — Progress Notes (Signed)
Subjective:    Patient ID: Jesus Arnold, male    DOB: 30-Sep-1941, 76 y.o.   MRN: [id number]  HPI  Here for wellness and f/u;  Overall doing ok;  Pt denies Chest pain, worsening SOB, DOE, wheezing, orthopnea, PND, worsening LE edema, palpitations, dizziness or syncope.  Pt denies neurological change such as new headache, facial or extremity weakness.  Pt states overall good compliance with treatment and medications, good tolerability, and has been trying to follow appropriate diet.  Pt denies worsening depressive symptoms, suicidal ideation or panic. No fever, night sweats, wt loss, loss of appetite, or other constitutional symptoms.  Pt states good ability with ADL's, has low fall risk, home safety reviewed and adequate, no other significant changes in hearing or vision, and only occasionally active with exercise.  Wt Readings from Last 3 Encounters:  11/17/17 172 lb (78 kg)  03/05/17 173 lb (78.5 kg)  01/28/17 181 lb (82.1 kg)   BP Readings from Last 3 Encounters:  11/17/17 110/78  03/05/17 120/82  01/28/17 112/80   Pt denies polydipsia, polyuria, or low sugar symptoms such as weakness or confusion improved with po intake.  Pt states overall good compliance with meds, trying to follow lower cholesterol, diabetic diet, wt overall stable but little exercise however.   Does have eczema like lesions to extremities worsening recentlly, as well as left groin rash erythema mild itchiness, no pain swelling or ulcers Past Medical History:  Diagnosis Date  . GERD (gastroesophageal reflux disease)   . Neuropathy 11/17/2017   History reviewed. No pertinent surgical history.  reports that he quit smoking about 9 months ago. he has never used smokeless tobacco. He reports that he does not drink alcohol or use drugs. family history is not on file. No Known Allergies Current Outpatient Medications on File Prior to Visit  Medication Sig Dispense Refill  . betamethasone valerate (VALISONE) 0.1 % cream  Apply topically 2 (two) times daily. 30 g 1  . blood glucose meter kit and supplies KIT Dispense based on patient and insurance preference. Use up to four times daily as directed. (E11.9). 1 each 0  . Blood Glucose Monitoring Suppl (ONE TOUCH ULTRA MINI) w/Device KIT Use meter to check blood sugars 1-4 times daily as instructed. 1 each 0  . glucose blood (ONE TOUCH ULTRA TEST) test strip Use one strip per test. Test blood sugars 1-4 times daily as instructed. 100 each 12  . Lancets Misc. (ONE TOUCH SURESOFT) MISC Use 1 lancet per test. Test blood sugars 1-4 times per day as instructed. 1 each 1  . metFORMIN (GLUCOPHAGE XR) 500 MG 24 hr tablet Take 1 tablet (500 mg total) by mouth daily with breakfast. 30 tablet 2  . Multiple Vitamin (MULTIVITAMIN) tablet Take 1 tablet by mouth daily.    . rosuvastatin (CRESTOR) 10 MG tablet Take 1 tablet (10 mg total) by mouth daily. 30 tablet 2   No current facility-administered medications on file prior to visit.    Review of Systems Constitutional: Negative for other unusual diaphoresis, sweats, appetite or weight changes HENT: Negative for other worsening hearing loss, ear pain, facial swelling, mouth sores or neck stiffness.   Eyes: Negative for other worsening pain, redness or other visual disturbance.  Respiratory: Negative for other stridor or swelling Cardiovascular: Negative for other palpitations or other chest pain  Gastrointestinal: Negative for worsening diarrhea or loose stools, blood in stool, distention or other pain Genitourinary: Negative for hematuria, flank pain or other change in urine  volume.  Musculoskeletal: Negative for myalgias or other joint swelling.  Skin: Negative for other color change, or other wound or worsening drainage.  Neurological: Negative for other syncope or numbness. Hematological: Negative for other adenopathy or swelling Psychiatric/Behavioral: Negative for hallucinations, other worsening agitation, SI, self-injury,  or new decreased concentration All other system neg per pt    Objective:   Physical Exam BP 110/78   Pulse 74   Temp 97.8 F (36.6 C) (Oral)   Ht 5' 6"  (1.676 m)   Wt 172 lb (78 kg)   SpO2 98%   BMI 27.76 kg/m  VS noted,  Constitutional: Pt is oriented to person, place, and time. Appears well-developed and well-nourished, in no significant distress and comfortable Head: Normocephalic and atraumatic  Eyes: Conjunctivae and EOM are normal. Pupils are equal, round, and reactive to light Right Ear: External ear normal without discharge Left Ear: External ear normal without discharge Nose: Nose without discharge or deformity Mouth/Throat: Oropharynx is without other ulcerations and moist  Neck: Normal range of motion. Neck supple. No JVD present. No tracheal deviation present or significant neck LA or mass Cardiovascular: Normal rate, regular rhythm, normal heart sounds and intact distal pulses.   Pulmonary/Chest: WOB normal and breath sounds without rales or wheezing  Abdominal: Soft. Bowel sounds are normal. NT. No HSM  Musculoskeletal: Normal range of motion. Exhibits no edema Lymphadenopathy: Has no other cervical adenopathy.  Neurological: Pt is alert and oriented to person, place, and time. Pt has normal reflexes. No cranial nerve deficit. Motor grossly intact, Gait intact Skin: Skin is warm and dry. + eczema rash multiple to extremities, and left groin rash erythema with mild maceration noted but no new ulcerations Psychiatric:  Has normal mood and affect. Behavior is normal without agitation No other exam findings  Lab Results  Component Value Date   HGBA1C 9.7 (H) 01/28/2017   Lab Results  Component Value Date   WBC 4.5 11/17/2017   HGB 14.9 11/17/2017   HCT 44.5 11/17/2017   PLT 245.0 11/17/2017   GLUCOSE 90 11/17/2017   CHOL 165 11/17/2017   TRIG 71.0 11/17/2017   HDL 42.60 11/17/2017   LDLCALC 108 (H) 11/17/2017   ALT 21 11/17/2017   AST 35 11/17/2017   NA 139  11/17/2017   K 4.2 11/17/2017   CL 102 11/17/2017   CREATININE 0.91 11/17/2017   BUN 7 11/17/2017   CO2 29 11/17/2017   TSH 0.95 11/17/2017   PSA 0.49 11/17/2017   HGBA1C 7.0 (H) 11/17/2017   MICROALBUR 0.9 11/17/2017      Assessment & Plan:

## 2017-11-17 NOTE — Patient Instructions (Addendum)
You had the Tdap and Prevnar 13 pneumonia shot today  Your shingles shot was sent to the pharmacy  Please take all new medication as prescribed - the two creams  Please continue all other medications as before, and refills have been done if requested.  Please have the pharmacy call with any other refills you may need.  Please continue your efforts at being more active, low cholesterol diet, and weight control.  You are otherwise up to date with prevention measures today.  Please keep your appointments with your specialists as you may have planned  Please go to the LAB in the Basement (turn left off the elevator) for the tests to be done today  You will be contacted by phone if any changes need to be made immediately.  Otherwise, you will receive a letter about your results with an explanation, but please check with MyChart first.  Please remember to sign up for MyChart if you have not done so, as this will be important to you in the future with finding out test results, communicating by private email, and scheduling acute appointments online when needed.  Please return in 6 months, or sooner if needed, with Lab testing done 3-5 days before

## 2017-11-20 NOTE — Assessment & Plan Note (Signed)
Mild to mod, for triam cr prn,  to f/u any worsening symptoms or concerns 

## 2017-11-20 NOTE — Assessment & Plan Note (Signed)
stable overall by history and exam, recent data reviewed with pt, and pt to continue medical treatment as before,  to f/u any worsening symptoms or concerns, declines statin for now, goal ldl < 70

## 2017-11-20 NOTE — Assessment & Plan Note (Signed)

## 2017-11-20 NOTE — Assessment & Plan Note (Addendum)
C/w likely fungal, for ketoconazole cr prn,  to f/u any worsening symptoms or concerns, consider derm referral  In addition to the time spent performing CPE, I spent an additional 15 minutes face to face,in which greater than 50% of this time was spent in counseling and coordination of care for patient's illness as documented, including the differential dx, treatment, further evaluation and other management of groin rash, eczema, DM and HLD

## 2017-11-20 NOTE — Assessment & Plan Note (Signed)
Lab Results  Component Value Date   HGBA1C 7.0 (H) 11/17/2017  stable overall by history and exam, recent data reviewed with pt, and pt to continue medical treatment as before,  to f/u any worsening symptoms or concerns 

## 2018-02-03 DIAGNOSIS — H2511 Age-related nuclear cataract, right eye: Secondary | ICD-10-CM | POA: Diagnosis not present

## 2018-02-03 DIAGNOSIS — E119 Type 2 diabetes mellitus without complications: Secondary | ICD-10-CM | POA: Diagnosis not present

## 2018-02-03 DIAGNOSIS — H02054 Trichiasis without entropian left upper eyelid: Secondary | ICD-10-CM | POA: Diagnosis not present

## 2018-02-03 DIAGNOSIS — H35351 Cystoid macular degeneration, right eye: Secondary | ICD-10-CM | POA: Diagnosis not present

## 2018-02-03 DIAGNOSIS — H34831 Tributary (branch) retinal vein occlusion, right eye, with macular edema: Secondary | ICD-10-CM | POA: Diagnosis not present

## 2018-03-28 DIAGNOSIS — H2511 Age-related nuclear cataract, right eye: Secondary | ICD-10-CM | POA: Diagnosis not present

## 2018-03-28 DIAGNOSIS — H35351 Cystoid macular degeneration, right eye: Secondary | ICD-10-CM | POA: Diagnosis not present

## 2018-03-28 DIAGNOSIS — E119 Type 2 diabetes mellitus without complications: Secondary | ICD-10-CM | POA: Diagnosis not present

## 2018-03-28 DIAGNOSIS — H34831 Tributary (branch) retinal vein occlusion, right eye, with macular edema: Secondary | ICD-10-CM | POA: Diagnosis not present

## 2018-05-16 DIAGNOSIS — H2511 Age-related nuclear cataract, right eye: Secondary | ICD-10-CM | POA: Diagnosis not present

## 2018-05-16 DIAGNOSIS — H34831 Tributary (branch) retinal vein occlusion, right eye, with macular edema: Secondary | ICD-10-CM | POA: Diagnosis not present

## 2018-05-16 DIAGNOSIS — H35351 Cystoid macular degeneration, right eye: Secondary | ICD-10-CM | POA: Diagnosis not present

## 2018-05-16 DIAGNOSIS — E119 Type 2 diabetes mellitus without complications: Secondary | ICD-10-CM | POA: Diagnosis not present

## 2018-05-18 ENCOUNTER — Ambulatory Visit: Payer: Medicare PPO | Admitting: Internal Medicine

## 2018-05-18 ENCOUNTER — Other Ambulatory Visit (INDEPENDENT_AMBULATORY_CARE_PROVIDER_SITE_OTHER): Payer: Medicare PPO

## 2018-05-18 ENCOUNTER — Encounter: Payer: Self-pay | Admitting: Internal Medicine

## 2018-05-18 VITALS — BP 112/78 | HR 94 | Temp 98.4°F | Ht 66.0 in | Wt 197.0 lb

## 2018-05-18 DIAGNOSIS — Z0001 Encounter for general adult medical examination with abnormal findings: Secondary | ICD-10-CM

## 2018-05-18 DIAGNOSIS — E119 Type 2 diabetes mellitus without complications: Secondary | ICD-10-CM

## 2018-05-18 DIAGNOSIS — E785 Hyperlipidemia, unspecified: Secondary | ICD-10-CM

## 2018-05-18 DIAGNOSIS — L409 Psoriasis, unspecified: Secondary | ICD-10-CM

## 2018-05-18 LAB — LIPID PANEL
CHOLESTEROL: 203 mg/dL — AB (ref 0–200)
HDL: 42.2 mg/dL (ref >39.00)
LDL Cholesterol: 138 mg/dL — ABNORMAL HIGH (ref 0–99)
NONHDL: 160.64
Total CHOL/HDL Ratio: 5
Triglycerides: 113 mg/dL (ref 0.0–149.0)
VLDL: 22.6 mg/dL (ref 0.0–40.0)

## 2018-05-18 LAB — HEMOGLOBIN A1C: HEMOGLOBIN A1C: 8.2 % — AB (ref 4.6–6.5)

## 2018-05-18 LAB — MICROALBUMIN / CREATININE URINE RATIO
CREATININE, U: 160.5 mg/dL
MICROALB/CREAT RATIO: 0.8 mg/g (ref 0.0–30.0)
Microalb, Ur: 1.2 mg/dL (ref 0.0–1.9)

## 2018-05-18 LAB — CBC WITH DIFFERENTIAL/PLATELET
BASOS PCT: 1.4 % (ref 0.0–3.0)
Basophils Absolute: 0.1 10*3/uL (ref 0.0–0.1)
EOS ABS: 0.2 10*3/uL (ref 0.0–0.7)
Eosinophils Relative: 4 % (ref 0.0–5.0)
HEMATOCRIT: 44 % (ref 39.0–52.0)
HEMOGLOBIN: 15.2 g/dL (ref 13.0–17.0)
LYMPHS PCT: 35.9 % (ref 12.0–46.0)
Lymphs Abs: 1.8 10*3/uL (ref 0.7–4.0)
MCHC: 34.4 g/dL (ref 30.0–36.0)
MCV: 86.7 fl (ref 78.0–100.0)
Monocytes Absolute: 0.4 10*3/uL (ref 0.1–1.0)
Monocytes Relative: 8.4 % (ref 3.0–12.0)
Neutro Abs: 2.5 10*3/uL (ref 1.4–7.7)
Neutrophils Relative %: 50.3 % (ref 43.0–77.0)
Platelets: 235 10*3/uL (ref 150.0–400.0)
RBC: 5.08 Mil/uL (ref 4.22–5.81)
RDW: 14.9 % (ref 11.5–15.5)
WBC: 4.9 10*3/uL (ref 4.0–10.5)

## 2018-05-18 LAB — BASIC METABOLIC PANEL
BUN: 7 mg/dL (ref 6–23)
CO2: 29 mEq/L (ref 19–32)
CREATININE: 1.08 mg/dL (ref 0.40–1.50)
Calcium: 9.7 mg/dL (ref 8.4–10.5)
Chloride: 103 mEq/L (ref 96–112)
GFR: 85.74 mL/min (ref >60.00)
GLUCOSE: 191 mg/dL — AB (ref 70–99)
Potassium: 3.8 mEq/L (ref 3.5–5.1)
Sodium: 141 mEq/L (ref 135–145)

## 2018-05-18 LAB — URINALYSIS, ROUTINE W REFLEX MICROSCOPIC
Bilirubin Urine: NEGATIVE
HGB URINE DIPSTICK: NEGATIVE
Ketones, ur: NEGATIVE
Leukocytes, UA: NEGATIVE
NITRITE: NEGATIVE
RBC / HPF: NONE SEEN (ref 0–?)
Specific Gravity, Urine: 1.025 (ref 1.000–1.030)
TOTAL PROTEIN, URINE-UPE24: NEGATIVE
Urine Glucose: NEGATIVE
Urobilinogen, UA: 0.2 (ref 0.0–1.0)
pH: 5.5 (ref 5.0–8.0)

## 2018-05-18 LAB — HEPATIC FUNCTION PANEL
ALK PHOS: 74 U/L (ref 39–117)
ALT: 25 U/L (ref 0–53)
AST: 34 U/L (ref 0–37)
Albumin: 4 g/dL (ref 3.5–5.2)
BILIRUBIN DIRECT: 0.1 mg/dL (ref 0.0–0.3)
TOTAL PROTEIN: 7.5 g/dL (ref 6.0–8.3)
Total Bilirubin: 0.5 mg/dL (ref 0.2–1.2)

## 2018-05-18 LAB — PSA: PSA: 0.6 ng/mL (ref 0.10–4.00)

## 2018-05-18 LAB — TSH: TSH: 1.16 u[IU]/mL (ref 0.35–4.50)

## 2018-05-18 MED ORDER — CLOBETASOL PROPIONATE 0.05 % EX CREA: 1.0000 "application " | TOPICAL_CREAM | Freq: Two times a day (BID) | CUTANEOUS | 1 refills | Status: DC

## 2018-05-18 MED ORDER — METHYLPREDNISOLONE ACETATE 80 MG/ML IJ SUSP
80.0000 mg | Freq: Once | INTRAMUSCULAR | Status: AC
  Administered 2018-05-18: 80 mg via INTRAMUSCULAR

## 2018-05-18 NOTE — Assessment & Plan Note (Signed)

## 2018-05-18 NOTE — Assessment & Plan Note (Signed)
Lab Results  Component Value Date   LDLCALC 108 (H) 11/17/2017  goal < 70, for f/u lab today, low chol DM diet

## 2018-05-18 NOTE — Assessment & Plan Note (Addendum)
With worsening large plaques, d/w pt that I do not normally rx biologics, ok for depomedrol IM 80, topical steroid asd and refer dermatology  In addition to the time spent performing CPE, I spent an additional 15 minutes face to face,in which greater than 50% of this time was spent in counseling and coordination of care for patient's illness as documented, including the differential dx, treatment, further evaluation and other management of psoriasis, DM ., HLD

## 2018-05-18 NOTE — Assessment & Plan Note (Signed)
stable overall by history and exam, recent data reviewed with pt, and pt to continue medical treatment as before,  to f/u any worsening symptoms or concerns Lab Results  Component Value Date   HGBA1C 7.0 (H) 11/17/2017  to call for cbg > 200 with steroiid tx

## 2018-05-18 NOTE — Addendum Note (Signed)
Addended by: Roney MansGAY, Jailyn Leeson on: 05/18/2018 02:38 PM   Modules accepted: Orders

## 2018-05-18 NOTE — Progress Notes (Signed)
Subjective:    Patient ID: Jesus Arnold, male    DOB: 07-27-1941, 77 y.o.   MRN: [id number]  HPI  Here for wellness and f/u;  Overall doing ok;  Pt denies Chest pain, worsening SOB, DOE, wheezing, orthopnea, PND, worsening LE edema, palpitations, dizziness or syncope.  Pt denies neurological change such as new headache, facial or extremity weakness.  Pt denies polydipsia, polyuria, or low sugar symptoms. Pt states overall good compliance with treatment and medications, good tolerability, and has been trying to follow appropriate diet.  Pt denies worsening depressive symptoms, suicidal ideation or panic. No fever, night sweats, wt loss, loss of appetite, or other constitutional symptoms.  Pt states good ability with ADL's, has low fall risk, home safety reviewed and adequate, no other significant changes in hearing or vision, and only occasionally active with exercise. Also with psoriasis getting much worse recently, asking for tx such as ilumya, as well as also groin rash persists despite antifungal cream.  Getting right eye shots for what sounds like macular degeneration.   Past Medical History:  Diagnosis Date  . GERD (gastroesophageal reflux disease)   . 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  reports that he quit smoking about 15 months ago. He has never used smokeless tobacco. He reports that he does not drink alcohol or use drugs. family history is not on file. No Known Allergies Current Outpatient Medications on File Prior to Visit  Medication Sig Dispense Refill  . betamethasone valerate (VALISONE) 0.1 % cream Apply topically 2 (two) times daily. 30 g 1  . blood glucose meter kit and supplies KIT Dispense based on patient and insurance preference. Use up to four times daily as directed. (E11.9). 1 each 0  . glucose blood (ONE TOUCH ULTRA TEST) test strip Use one strip per test. Test blood sugars 1-4 times daily as instructed. 100 each 12  . ketoconazole (NIZORAL) 2 %  cream Apply 1 application topically daily. 30 g 0  . Lancets Misc. (ONE TOUCH SURESOFT) MISC Use 1 lancet per test. Test blood sugars 1-4 times per day as instructed. 1 each 1  . metFORMIN (GLUCOPHAGE XR) 500 MG 24 hr tablet Take 1 tablet (500 mg total) by mouth daily with breakfast. 30 tablet 2  . Multiple Vitamin (MULTIVITAMIN) tablet Take 1 tablet by mouth daily.    . rosuvastatin (CRESTOR) 10 MG tablet Take 1 tablet (10 mg total) by mouth daily. 30 tablet 2  . triamcinolone cream (KENALOG) 0.1 % Apply 1 application topically 2 (two) times daily. 30 g 1   No current facility-administered medications on file prior to visit.    Review of Systems Constitutional: Negative for other unusual diaphoresis, sweats, appetite or weight changes HENT: Negative for other worsening hearing loss, ear pain, facial swelling, mouth sores or neck stiffness.   Eyes: Negative for other worsening pain, redness or other visual disturbance.  Respiratory: Negative for other stridor or swelling Cardiovascular: Negative for other palpitations or other chest pain  Gastrointestinal: Negative for worsening diarrhea or loose stools, blood in stool, distention or other pain Genitourinary: Negative for hematuria, flank pain or other change in urine volume.  Musculoskeletal: Negative for myalgias or other joint swelling.  Skin: Negative for other color change, or other wound or worsening drainage.  Neurological: Negative for other syncope or numbness. Hematological: Negative for other adenopathy or swelling Psychiatric/Behavioral: Negative for hallucinations, other worsening agitation, SI, self-injury, or new decreased concentration All other system neg per pt  Objective:   Physical Exam BP 112/78   Pulse 94   Temp 98.4 F (36.9 C) (Oral)   Ht 5' 6"  (1.676 m)   Wt 197 lb (89.4 kg)   SpO2 100%   BMI 31.80 kg/m  VS noted, not ill appearing Constitutional: Pt is oriented to person, place, and time. Appears  well-developed and well-nourished, in no significant distress and comfortable Head: Normocephalic and atraumatic  Eyes: Conjunctivae and EOM are normal. Pupils are equal, round, and reactive to light Right Ear: External ear normal without discharge Left Ear: External ear normal without discharge Nose: Nose without discharge or deformity Mouth/Throat: Oropharynx is without other ulcerations and moist  Neck: Normal range of motion. Neck supple. No JVD present. No tracheal deviation present or significant neck LA or mass Cardiovascular: Normal rate, regular rhythm, normal heart sounds and intact distal pulses.  Pulmonary/Chest: WOB normal and breath sounds without rales or wheezing  Abdominal: Soft. Bowel sounds are normal. NT. No HSM  Musculoskeletal: Normal range of motion. Exhibits no edema Lymphadenopathy: Has no other cervical adenopathy.  Neurological: Pt is alert and oriented to person, place, and time. Pt has normal reflexes. No cranial nerve deficit. Motor grossly intact, Gait intact Skin: Skin is warm and dry. No new ulcerations, but has large psoriatic plaques to left and right distal legs and left groin area Psychiatric:  Has normal mood and affect. Behavior is normal without agitation No other exam findings Lab Results  Component Value Date   WBC 4.5 11/17/2017   HGB 14.9 11/17/2017   HCT 44.5 11/17/2017   PLT 245.0 11/17/2017   GLUCOSE 90 11/17/2017   CHOL 165 11/17/2017   TRIG 71.0 11/17/2017   HDL 42.60 11/17/2017   LDLCALC 108 (H) 11/17/2017   ALT 21 11/17/2017   AST 35 11/17/2017   NA 139 11/17/2017   K 4.2 11/17/2017   CL 102 11/17/2017   CREATININE 0.91 11/17/2017   BUN 7 11/17/2017   CO2 29 11/17/2017   TSH 0.95 11/17/2017   PSA 0.49 11/17/2017   HGBA1C 7.0 (H) 11/17/2017   MICROALBUR 0.9 11/17/2017        Assessment & Plan:

## 2018-05-18 NOTE — Patient Instructions (Addendum)
You had the steroid shot today for psoriasis  Please continue all other medications as before, and refills have been done if requested.  Please have the pharmacy call with any other refills you may need.  Please continue your efforts at being more active, low cholesterol diet, and weight control.  You are otherwise up to date with prevention measures today.  Please keep your appointments with your specialists as you may have planned  You will be contacted regarding the referral for: Dermatology  Please go to the LAB in the Basement (turn left off the elevator) for the tests to be done today  You will be contacted by phone if any changes need to be made immediately.  Otherwise, you will receive a letter about your results with an explanation, but please check with MyChart first.  Please remember to sign up for MyChart if you have not done so, as this will be important to you in the future with finding out test results, communicating by private email, and scheduling acute appointments online when needed.  Please return in 6 months, or sooner if needed, with Lab testing done 3-5 days before

## 2018-05-20 ENCOUNTER — Telehealth: Payer: Self-pay

## 2018-05-20 ENCOUNTER — Other Ambulatory Visit: Payer: Self-pay | Admitting: Internal Medicine

## 2018-05-20 ENCOUNTER — Encounter: Payer: Self-pay | Admitting: Internal Medicine

## 2018-05-20 MED ORDER — METFORMIN HCL ER 500 MG PO TB24: 1500.0000 mg | ORAL_TABLET | Freq: Every day | ORAL | 3 refills | Status: DC

## 2018-05-20 MED ORDER — ROSUVASTATIN CALCIUM 40 MG PO TABS: 40.0000 mg | ORAL_TABLET | Freq: Every day | ORAL | 3 refills | Status: DC

## 2018-05-20 NOTE — Telephone Encounter (Signed)
-----   Message from Corwin LevinsJames W John, MD sent at 05/20/2018  7:55 AM EDT ----- Letter sent, cont same tx except  The test results show that your current treatment is OK, except the A1c and LDL cholesterol are too high.  The goal is to be under 7 for the A1c, and LDL less than 70.  Please increase the metformin ER 500 mg to three pills in the AM, and increase the Crestor to 40 mg per day.  This will improve your metabolism and lead to less risk of heart attack and stroke in the future.  I will send the prescriptions, and you should hear from the office as well.Jesus Arnold.  Jesus Arnold to please inform pt, I will do rx x 2

## 2018-05-20 NOTE — Telephone Encounter (Signed)
Called pt, phone rings, no VM  CRM created

## 2018-06-29 ENCOUNTER — Telehealth: Payer: Self-pay | Admitting: *Deleted

## 2018-06-29 NOTE — Telephone Encounter (Signed)
LVM for patient to call back in regards to scheduling AWV with our health coach.  

## 2018-06-30 ENCOUNTER — Encounter: Payer: Self-pay | Admitting: Internal Medicine

## 2018-06-30 ENCOUNTER — Ambulatory Visit: Payer: Medicare PPO | Admitting: Internal Medicine

## 2018-06-30 ENCOUNTER — Ambulatory Visit (INDEPENDENT_AMBULATORY_CARE_PROVIDER_SITE_OTHER): Payer: Medicare PPO | Admitting: *Deleted

## 2018-06-30 VITALS — BP 122/86 | HR 92 | Temp 98.4°F | Ht 66.0 in | Wt 194.0 lb

## 2018-06-30 VITALS — BP 122/86 | HR 92 | Ht 66.0 in | Wt 194.0 lb

## 2018-06-30 DIAGNOSIS — E785 Hyperlipidemia, unspecified: Secondary | ICD-10-CM

## 2018-06-30 DIAGNOSIS — Z0000 Encounter for general adult medical examination without abnormal findings: Secondary | ICD-10-CM | POA: Diagnosis not present

## 2018-06-30 DIAGNOSIS — E119 Type 2 diabetes mellitus without complications: Secondary | ICD-10-CM

## 2018-06-30 DIAGNOSIS — K219 Gastro-esophageal reflux disease without esophagitis: Secondary | ICD-10-CM | POA: Diagnosis not present

## 2018-06-30 MED ORDER — PANTOPRAZOLE SODIUM 40 MG PO TBEC: 40.0000 mg | DELAYED_RELEASE_TABLET | Freq: Every day | ORAL | 3 refills | Status: DC

## 2018-06-30 NOTE — Telephone Encounter (Signed)
Called and confirmed AWV for 2:00 today with patient.

## 2018-06-30 NOTE — Assessment & Plan Note (Signed)
Tolerating increased crestor 40 mg, cont diet, f/u lab next visit

## 2018-06-30 NOTE — Assessment & Plan Note (Signed)
Mild uncontrolled with recent labs with symptoms improved now with increased metformin, to cont same dose, f/u a1c with next labs

## 2018-06-30 NOTE — Assessment & Plan Note (Signed)
Ok to start protonix 40 qd,  to f/u any worsening symptoms or concerns

## 2018-06-30 NOTE — Patient Instructions (Addendum)
Continue doing brain stimulating activities (puzzles, reading, adult coloring books, staying active) to keep memory sharp.   Continue to eat heart healthy diet (full of fruits, vegetables, whole grains, lean protein, water--limit salt, fat, and sugar intake) and increase physical activity as tolerated.   [name] , Thank you for taking time to come for your Medicare Wellness Visit. I appreciate your ongoing commitment to your health goals. Please review the following plan we discussed and let me know if I can assist you in the future.   These are the goals we discussed: Goals    . Patient Stated     I want to continue to be happy and peaceful. Stay active and enjoy life. Start to monitor diet for sugar and carbohydrates. Drink more water.       This is a list of the screening recommended for you and due dates:  Health Maintenance  Topic Date Due  . Flu Shot  06/30/2018  . Colon Cancer Screening  05/19/2019*  . Complete foot exam   11/17/2018  . Hemoglobin A1C  11/17/2018  . Pneumonia vaccines (2 of 2 - PPSV23) 11/17/2018  . Eye exam for diabetics  05/17/2019  . Urine Protein Check  05/19/2019  . Tetanus Vaccine  11/18/2027  *Topic was postponed. The date shown is not the original due date.    High-Fiber Diet Fiber, also called dietary fiber, is a type of carbohydrate found in fruits, vegetables, whole grains, and beans. A high-fiber diet can have many health benefits. Your health care provider may recommend a high-fiber diet to help:  Prevent constipation. Fiber can make your bowel movements more regular.  Lower your cholesterol.  Relieve hemorrhoids, uncomplicated diverticulosis, or irritable bowel syndrome.  Prevent overeating as part of a weight-loss plan.  Prevent heart disease, type 2 diabetes, and certain cancers.  What is my plan? The recommended daily intake of fiber includes:  38 grams for men under age 19.  30 grams for men over age 41.  25 grams for  women under age 79.  21 grams for women over age 53.  You can get the recommended daily intake of dietary fiber by eating a variety of fruits, vegetables, grains, and beans. Your health care provider may also recommend a fiber supplement if it is not possible to get enough fiber through your diet. What do I need to know about a high-fiber diet?  Fiber supplements have not been widely studied for their effectiveness, so it is better to get fiber through food sources.  Always check the fiber content on thenutrition facts label of any prepackaged food. Look for foods that contain at least 5 grams of fiber per serving.  Ask your dietitian if you have questions about specific foods that are related to your condition, especially if those foods are not listed in the following section.  Increase your daily fiber consumption gradually. Increasing your intake of dietary fiber too quickly may cause bloating, cramping, or gas.  Drink plenty of water. Water helps you to digest fiber. What foods can I eat? Grains Whole-grain breads. Multigrain cereal. Oats and oatmeal. Brown rice. Barley. Bulgur wheat. Millet. Bran muffins. Popcorn. Rye wafer crackers. Vegetables Sweet potatoes. Spinach. Kale. Artichokes. Cabbage. Broccoli. Green peas. Carrots. Squash. Fruits Berries. Pears. Apples. Oranges. Avocados. Prunes and raisins. Dried figs. Meats and Other Protein Sources Navy, kidney, pinto, and soy beans. Split peas. Lentils. Nuts and seeds. Dairy Fiber-fortified yogurt. Beverages Fiber-fortified soy milk. Fiber-fortified orange juice. Other Fiber bars. The  items listed above may not be a complete list of recommended foods or beverages. Contact your dietitian for more options. What foods are not recommended? Grains White bread. Pasta made with refined flour. White rice. Vegetables Fried potatoes. Canned vegetables. Well-cooked vegetables. Fruits Fruit juice. Cooked, strained fruit. Meats and  Other Protein Sources Fatty cuts of meat. Fried Environmental education officerpoultry or fried fish. Dairy Milk. Yogurt. Cream cheese. Sour cream. Beverages Soft drinks. Other Cakes and pastries. Butter and oils. The items listed above may not be a complete list of foods and beverages to avoid. Contact your dietitian for more information. What are some tips for including high-fiber foods in my diet?  Eat a wide variety of high-fiber foods.  Make sure that half of all grains consumed each day are whole grains.  Replace breads and cereals made from refined flour or white flour with whole-grain breads and cereals.  Replace white rice with brown rice, bulgur wheat, or millet.  Start the day with a breakfast that is high in fiber, such as a cereal that contains at least 5 grams of fiber per serving.  Use beans in place of meat in soups, salads, or pasta.  Eat high-fiber snacks, such as berries, raw vegetables, nuts, or popcorn. This information is not intended to replace advice given to you by your health care provider. Make sure you discuss any questions you have with your health care provider. Document Released: 11/16/2005 Document Revised: 04/23/2016 Document Reviewed: 05/01/2014 Elsevier Interactive Patient Education  2018 [employer]  Protein Content in Foods Generally, most healthy people need around 50 grams of protein each day. Depending on your overall health, you may need more or less protein in your diet. Talk to your health care provider or dietitian about how much protein you need. See the following list for the protein content of some common foods. High-protein foods High-protein foods contain 4 grams (4 g) or more of protein per serving. They include:  Beef, ground sirloin (cooked) - 3 oz have 24 g of protein.  Cheese (hard) - 1 oz has 7 g of protein.  Chicken breast, boneless and skinless (cooked) - 3 oz have 13.4 g of protein.  Cottage cheese - 1/2 cup has 13.4 g of protein.  Egg - 1 egg  has 6 g of protein.  Fish, filet (cooked) - 1 oz has 6-7 g of protein.  Garbanzo beans (canned or cooked) - 1/2 cup has 6-7 g of protein.  Kidney beans (canned or cooked) - 1/2 cup has 6-7 g of protein.  Lamb (cooked) - 3 oz has 24 g of protein.  Milk - 1 cup (8 oz) has 8 g of protein.  Nuts (peanuts, pistachios, almonds) - 1 oz has 6 g of protein.  Peanut butter - 1 oz has 7-8 g of protein.  Pork tenderloin (cooked) - 3 oz has 18.4 g of protein.  Pumpkin seeds - 1 oz has 8.5 g of protein.  Soybeans (roasted) - 1 oz has 8 g of protein.  Soybeans (cooked) - 1/2 cup has 11 g of protein.  Soy milk - 1 cup (8 oz) has 5-10 g of protein.  Soy or vegetable patty - 1 patty has 11 g of protein.  Sunflower seeds - 1 oz has 5.5 g of protein.  Tofu (firm) - 1/2 cup has 20 g of protein.  Tuna (canned in water) - 3 oz has 20 g of protein.  Yogurt - 6 oz has 8 g of protein.  Low-protein  foods Low-protein foods contain 3 grams (3 g) or less of protein per serving. They include:  Beets (raw or cooked) - 1/2 cup has 1.5 g of protein.  Bran cereal - 1/2 cup has 2-3 g of protein.  Bread - 1 slice has 2.5 g of protein.  Broccoli (raw or cooked) - 1/2 cup has 2 g of protein.  Collard greens (raw or cooked) - 1/2 cup has 2 g of protein.  Corn (fresh or cooked) - 1/2 cup has 2 g of protein.  Cream cheese - 1 oz has 2 g of protein.  Creamer (half-and-half) - 1 oz has 1 g of protein.  Flour tortilla - 1 tortilla has 2.5 g of protein  Frozen yogurt - 1/2 cup has 3 g of protein.  Fruit or vegetable juice - 1/2 cup has 1 g of protein.  Green beans (raw or cooked) - 1/2 cup has 1 g of protein.  Green peas (canned) - 1/2 cup has 3.5 g of protein.  Muffins - 1 small muffin (2 oz) has 3 g of protein.  Oatmeal (cooked) - 1/2 cup has 3 g of protein.  Potato (baked with skin) - 1 medium potato has 3 g of protein.  Rice (cooked) - 1/2 cup has 2.5-3.5 g of protein.  Sour cream -  1/2 cup has 2.5 g of protein.  Spinach (cooked) - 1/2 cup has 3 g of protein.  Squash (cooked) - 1/2 cup has 1.5 g of protein.  Actual amounts of protein may be different depending on processing. Talk with your health care provider or dietitian about what foods are recommended for you. This information is not intended to replace advice given to you by your health care provider. Make sure you discuss any questions you have with your health care provider. Document Released: 02/15/2016 Document Revised: 07/27/2016 Document Reviewed: 07/27/2016 Elsevier Interactive Patient Education  [employer].

## 2018-06-30 NOTE — Progress Notes (Addendum)
Subjective:   Jesus Arnold is a 77 y.o. male who presents for an Initial Medicare Annual Wellness Visit.  Review of Systems  No ROS.  Medicare Wellness Visit. Additional risk factors are reflected in the social history.  Cardiac Risk Factors include: advanced age (>38mn, >>53women);diabetes mellitus;dyslipidemia;male gender Sleep patterns: feels rested on waking, gets up 1 times nightly to void and sleeps 8 hours nightly.    Home Safety/Smoke Alarms: Feels safe in home. Smoke alarms in place.  Living environment; residence and Firearm Safety: 1-story house/ trailer, no firearms. Lives alone, no needs for DME, good support system Seat Belt Safety/Bike Helmet: Wears seat belt.   PSA-  Lab Results  Component Value Date   PSA 0.60 05/18/2018   PSA 0.49 11/17/2017      Objective:    Today's Vitals   06/30/18 1311  BP: 122/86  Pulse: 92  SpO2: 94%  Weight: 194 lb (88 kg)  Height: _0  (1.676 m)   Body mass index is 31.31 kg/m.  Advanced Directives 06/30/2018 12/31/2016  Does Patient Have a Medical Advance Directive? No No  Would patient like information on creating a medical advance directive? Yes (ED - Information included in AVS) No - Patient declined    Current Medications (verified) Outpatient Encounter Medications as of 06/30/2018  Medication Sig  . blood glucose meter kit and supplies KIT Dispense based on patient and insurance preference. Use up to four times daily as directed. (E11.9).  . clobetasol cream (TEMOVATE) 05.95% Apply 1 application topically 2 (two) times daily.  .Marland Kitchenglucose blood (ONE TOUCH ULTRA TEST) test strip Use one strip per test. Test blood sugars 1-4 times daily as instructed.  . Lancets Misc. (ONE TOUCH SURESOFT) MISC Use 1 lancet per test. Test blood sugars 1-4 times per day as instructed.  . metFORMIN (GLUCOPHAGE XR) 500 MG 24 hr tablet Take 3 tablets (1,500 mg total) by mouth daily with breakfast.  . Multiple Vitamin (MULTIVITAMIN) tablet Take  1 tablet by mouth daily.  . rosuvastatin (CRESTOR) 40 MG tablet Take 1 tablet (40 mg total) by mouth daily.   No facility-administered encounter medications on file as of 06/30/2018.     Allergies (verified) Patient has no known allergies.   History: Past Medical History:  Diagnosis Date  . Diabetes mellitus without complication (HRialto   . GERD (gastroesophageal reflux disease)   . Neuropathy 11/17/2017   History reviewed. No pertinent surgical history. History reviewed. No pertinent family history. Social History   Socioeconomic History  . Marital status: Single    Spouse name: Not on file  . Number of children: 2  . Years of education: 10  . Highest education level: Not on file  Occupational History  . Occupation: Retired  SScientific laboratory technician . Financial resource strain: Not hard at all  . Food insecurity:    Worry: Never true    Inability: Never true  . Transportation needs:    Medical: No    Non-medical: No  Tobacco Use  . Smoking status: Former Smoker    Last attempt to quit: 01/28/2017    Years since quitting: 1.4  . Smokeless tobacco: Never Used  Substance and Sexual Activity  . Alcohol use: No  . Drug use: No  . Sexual activity: Not Currently  Lifestyle  . Physical activity:    Days per week: 5 days    Minutes per session: 60 min  . Stress: Not at all  Relationships  . Social connections:  Talks on phone: More than three times a week    Gets together: More than three times a week    Attends religious service: Not on file    Active member of club or organization: Not on file    Attends meetings of clubs or organizations: Not on file    Relationship status: Not on file  Other Topics Concern  . Not on file  Social History Narrative   Fun/Hobby: Relax; Watch tv, walking.    Tobacco Counseling Counseling given: Not Answered   Activities of Daily Living In your present state of health, do you have any difficulty performing the following activities:  06/30/2018  Hearing? N  Vision? N  Difficulty concentrating or making decisions? N  Walking or climbing stairs? N  Dressing or bathing? N  Doing errands, shopping? N  Preparing Food and eating ? N  Using the Toilet? N  In the past six months, have you accidently leaked urine? N  Do you have problems with loss of bowel control? N  Managing your Medications? N  Managing your Finances? N  Housekeeping or managing your Housekeeping? N  Some recent data might be hidden     Immunizations and Health Maintenance Immunization History  Administered Date(s) Administered  . Influenza-Unspecified 09/30/2017  . Pneumococcal Conjugate-13 11/17/2017  . Tdap 11/17/2017   Health Maintenance Due  Topic Date Due  . INFLUENZA VACCINE  06/30/2018    Patient Care Team: Biagio Borg, MD as PCP - General (Internal Medicine)  Indicate any recent Medical Services you may have received from other than [facility] providers in the past year (date may be approximate).    Assessment:   This is a routine wellness examination for Jesus Arnold. Physical assessment deferred to PCP.   Hearing/Vision screen Hearing Screening Comments: Able to hear conversational tones w/o difficulty. No issues reported. Passed whisper test  Vision Screening Comments: appointment yearly Dr. [name]  Dietary issues and exercise activities discussed: Current Exercise Habits: Home exercise routine, Type of exercise: walking, Time (Minutes): 60, Frequency (Times/Week): 4, Weekly Exercise (Minutes/Week): 240, Intensity: Mild, Exercise limited by: None identified  Diet (meal preparation, eat out, water intake, caffeinated beverages, dairy products, fruits and vegetables): in general, a "healthy" diet  , well balanced   Reviewed heart healthy and diabetic diet. Encouraged patient to increase daily water and healthy fluid intake. Relevant patient education assigned to patient using Emmi. Diet education was attached to patient's AVS.   Goals      . Patient Stated     I want to continue to be happy and peaceful. Stay active and enjoy life. Start to monitor diet for sugar and carbohydrates. Drink more water.      Depression Screen PHQ 2/9 Scores 06/30/2018 05/18/2018 11/17/2017 03/05/2017  PHQ - 2 Score 0 0 0 0  PHQ- 9 Score 0 0 - -    Fall Risk Fall Risk  06/30/2018 05/18/2018 11/17/2017 03/05/2017  Falls in the past year? No No Yes Yes  Number falls in past yr: - - 1 1  Injury with Fall? - - No No  Risk for fall due to : - - - Impaired balance/gait    Cognitive Function: MMSE - Mini Mental State Exam 06/30/2018  Orientation to time 5  Orientation to Place 5  Registration 3  Attention/ Calculation 5  Recall 2  Language- name 2 objects 2  Language- repeat 1  Language- follow 3 step command 3  Language- read & follow direction 1  Write a sentence 1  Copy design 1  Total score 29        Screening Tests Health Maintenance  Topic Date Due  . INFLUENZA VACCINE  06/30/2018  . COLONOSCOPY  05/19/2019 (Originally 01/14/2016)  . FOOT EXAM  11/17/2018  . HEMOGLOBIN A1C  11/17/2018  . PNA vac Low Risk Adult (2 of 2 - PPSV23) 11/17/2018  . OPHTHALMOLOGY EXAM  05/17/2019  . URINE MICROALBUMIN  05/19/2019  . TETANUS/TDAP  11/18/2027      Plan:     [facility] referral placed for diabetes education.   Continue doing brain stimulating activities (puzzles, reading, adult coloring books, staying active) to keep memory sharp.   Continue to eat heart healthy diet (full of fruits, vegetables, whole grains, lean protein, water--limit salt, fat, and sugar intake) and increase physical activity as tolerated.  I have personally reviewed and noted the following in the patient's chart:   . Medical and social history . Use of alcohol, tobacco or illicit drugs  . Current medications and supplements . Functional ability and status . Nutritional status . Physical activity . Advanced directives . List of other physicians . Vitals . Screenings  to include cognitive, depression, and falls . Referrals and appointments  In addition, I have reviewed and discussed with patient certain preventive protocols, quality metrics, and best practice recommendations. A written personalized care plan for preventive services as well as general preventive health recommendations were provided to patient.     Michiel Cowboy, RN   06/30/2018      Medical screening examination/treatment/procedure(s) were performed by non-physician practitioner and as supervising physician I was immediately available for consultation/collaboration. I agree with above. Cathlean Cower, MD

## 2018-06-30 NOTE — Progress Notes (Signed)
Subjective:    Patient ID: Jesus Arnold, male    DOB: 30-Jun-1941, 77 y.o.   MRN: [id number]  HPI  Here to f/u last visit with c/o mild persistently recurring epigastric indigestion, some worse after eating, not really worse with the increased metformin or crestor as per last visit. Denies worsening dysphagia, n/v, bowel change or blood. Pt denies chest pain, increased sob or doe, wheezing, orthopnea, PND, increased LE swelling, palpitations, dizziness or syncope.  Pt denies new neurological symptoms such as new headache, or facial or extremity weakness or numbness   Pt denies polydipsia, polyuria - in fact mentions urination has slowed quite bit since taking more metformin.  No other new complaints BP Readings from Last 3 Encounters:  06/30/18 122/86  05/18/18 112/78  11/17/17 110/78   Wt Readings from Last 3 Encounters:  06/30/18 194 lb (88 kg)  05/18/18 197 lb (89.4 kg)  11/17/17 172 lb (78 kg)   Past Medical History:  Diagnosis Date  . GERD (gastroesophageal reflux disease)   . 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  reports that he quit smoking about 17 months ago. He has never used smokeless tobacco. He reports that he does not drink alcohol or use drugs. family history is not on file. No Known Allergies Current Outpatient Medications on File Prior to Visit  Medication Sig Dispense Refill  . blood glucose meter kit and supplies KIT Dispense based on patient and insurance preference. Use up to four times daily as directed. (E11.9). 1 each 0  . clobetasol cream (TEMOVATE) 4.09 % Apply 1 application topically 2 (two) times daily. 30 g 1  . glucose blood (ONE TOUCH ULTRA TEST) test strip Use one strip per test. Test blood sugars 1-4 times daily as instructed. 100 each 12  . Lancets Misc. (ONE TOUCH SURESOFT) MISC Use 1 lancet per test. Test blood sugars 1-4 times per day as instructed. 1 each 1  . metFORMIN (GLUCOPHAGE XR) 500 MG 24 hr tablet Take 3 tablets (1,500  mg total) by mouth daily with breakfast. 270 tablet 3  . Multiple Vitamin (MULTIVITAMIN) tablet Take 1 tablet by mouth daily.    . rosuvastatin (CRESTOR) 40 MG tablet Take 1 tablet (40 mg total) by mouth daily. 90 tablet 3   No current facility-administered medications on file prior to visit.    Review of Systems  Constitutional: Negative for other unusual diaphoresis or sweats HENT: Negative for ear discharge or swelling Eyes: Negative for other worsening visual disturbances Respiratory: Negative for stridor or other swelling  Gastrointestinal: Negative for worsening distension or other blood Genitourinary: Negative for retention or other urinary change Musculoskeletal: Negative for other MSK pain or swelling Skin: Negative for color change or other new lesions Neurological: Negative for worsening tremors and other numbness  Psychiatric/Behavioral: Negative for worsening agitation or other fatigue All other system neg per pt    Objective:   Physical Exam BP 122/86   Pulse 92   Temp 98.4 F (36.9 C) (Oral)   Ht 5' 6"  (1.676 m)   Wt 194 lb (88 kg)   SpO2 94%   BMI 31.31 kg/m  VS noted, not ill appearing Constitutional: Pt appears in NAD HENT: Head: NCAT.  Right Ear: External ear normal.  Left Ear: External ear normal.  Eyes: . Pupils are equal, round, and reactive to light. Conjunctivae and EOM are normal Nose: without d/c or deformity Neck: Neck supple. Gross normal ROM Cardiovascular: Normal rate and regular rhythm.  Pulmonary/Chest: Effort normal and breath sounds without rales or wheezing.  Abd:  Soft, ND, + BS, no organomegaly, mild epigastric tender Neurological: Pt is alert. At baseline orientation, motor grossly intact Skin: Skin is warm. No rashes, other new lesions, no LE edema Psychiatric: Pt behavior is normal without agitation  No other exam findings Lab Results  Component Value Date   WBC 4.9 05/18/2018   HGB 15.2 05/18/2018   HCT 44.0 05/18/2018   PLT  235.0 05/18/2018   GLUCOSE 191 (H) 05/18/2018   CHOL 203 (H) 05/18/2018   TRIG 113.0 05/18/2018   HDL 42.20 05/18/2018   LDLCALC 138 (H) 05/18/2018   ALT 25 05/18/2018   AST 34 05/18/2018   NA 141 05/18/2018   K 3.8 05/18/2018   CL 103 05/18/2018   CREATININE 1.08 05/18/2018   BUN 7 05/18/2018   CO2 29 05/18/2018   TSH 1.16 05/18/2018   PSA 0.60 05/18/2018   HGBA1C 8.2 (H) 05/18/2018   MICROALBUR 1.2 05/18/2018       Assessment & Plan:

## 2018-06-30 NOTE — Patient Instructions (Addendum)
Please take all new medication as prescribed - the reflux medication  Please continue all other medications as before, and refills have been done if requested.  Please have the pharmacy call with any other refills you may need.  Please continue your efforts at being more active, low cholesterol diet, and weight control.  Please keep your appointments with your specialists as you may have planned

## 2018-07-01 ENCOUNTER — Other Ambulatory Visit: Payer: Self-pay

## 2018-07-01 DIAGNOSIS — H35371 Puckering of macula, right eye: Secondary | ICD-10-CM | POA: Diagnosis not present

## 2018-07-01 DIAGNOSIS — E119 Type 2 diabetes mellitus without complications: Secondary | ICD-10-CM | POA: Diagnosis not present

## 2018-07-01 DIAGNOSIS — H34831 Tributary (branch) retinal vein occlusion, right eye, with macular edema: Secondary | ICD-10-CM | POA: Diagnosis not present

## 2018-07-01 DIAGNOSIS — H35351 Cystoid macular degeneration, right eye: Secondary | ICD-10-CM | POA: Diagnosis not present

## 2018-07-01 DIAGNOSIS — H2511 Age-related nuclear cataract, right eye: Secondary | ICD-10-CM | POA: Diagnosis not present

## 2018-07-01 NOTE — Patient Outreach (Signed)
[facility] Care Management  07/01/2018  Jesus MangesBobby Bunney Oct 25, 1941 [id number]   Referral Date: 07/01/18 Referral Source: MD referral Referral Reason:Diabetes   Outreach Attempt: No answer.  HIPAA compliant voice message left.  Plan: RN CM will send letter and attempt patient again within 4 business days.    Bary Lericheionne J Odilia Damico, RN, MSN [facility] Care Management Coordinator Direct Line [phone] Toll Free: [phone]  Fax: [phone]

## 2018-07-04 ENCOUNTER — Other Ambulatory Visit: Payer: Self-pay

## 2018-07-04 NOTE — Patient Outreach (Signed)
[facility] Care Management  07/04/2018  Jesus MangesBobby Arnold October 27, 1941 [id number]    Referral Date: 07/01/18 Referral Source: MD referral Referral Reason:Diabetes   Outreach Attempt: No answer.  HIPAA compliant voice message left.   Plan: RN CM will attempt patient again within 4 business days.   Bary Lericheionne J Kijana Cromie, RN, MSN [facility] Care Management Care Management Coordinator Direct Line [phone] Cell [phone] Toll Free: [phone]  Fax: [phone]

## 2018-07-05 ENCOUNTER — Other Ambulatory Visit: Payer: Self-pay

## 2018-07-05 ENCOUNTER — Ambulatory Visit: Payer: Self-pay

## 2018-07-05 NOTE — Patient Outreach (Signed)
[facility] Care Management  07/05/2018  Sherryl MangesBobby Schiavo 09-Sep-1941 [id number]   Referral Date:07/01/18 Referral Source:MD referral Referral Reason:Diabetes   Outreach Attempt:No answer. HIPAA compliant voice message left.   Plan: RN CM will wait return call if no return call will close case.  Bary Lericheionne J Gazelle Towe, RN, MSN [facility] Care Management Coordinator Direct Line [phone] Cell [phone] Toll Free: [phone]  Fax: [phone]

## 2018-07-15 ENCOUNTER — Other Ambulatory Visit: Payer: Self-pay

## 2018-07-15 NOTE — Patient Outreach (Signed)
[facility] Care Management  07/15/2018  Sherryl MangesBobby Guise Dec 13, 1940 [id number]   Multiple attempts to establish contact with patient without success. No response from letter mailed to patient.   Plan: RN CM will close case at this time.   Bary Lericheionne J Daniqua Campoy, RN, MSN [facility] Care Management Care Management Coordinator Direct Line [phone] Cell [phone] Toll Free: [phone]  Fax: [phone]

## 2018-08-18 DIAGNOSIS — H35351 Cystoid macular degeneration, right eye: Secondary | ICD-10-CM | POA: Diagnosis not present

## 2018-08-18 DIAGNOSIS — H2511 Age-related nuclear cataract, right eye: Secondary | ICD-10-CM | POA: Diagnosis not present

## 2018-08-18 DIAGNOSIS — H34831 Tributary (branch) retinal vein occlusion, right eye, with macular edema: Secondary | ICD-10-CM | POA: Diagnosis not present

## 2018-08-18 DIAGNOSIS — E119 Type 2 diabetes mellitus without complications: Secondary | ICD-10-CM | POA: Diagnosis not present

## 2018-08-18 DIAGNOSIS — H35371 Puckering of macula, right eye: Secondary | ICD-10-CM | POA: Diagnosis not present

## 2018-09-12 DIAGNOSIS — L309 Dermatitis, unspecified: Secondary | ICD-10-CM | POA: Diagnosis not present

## 2018-10-06 DIAGNOSIS — H35371 Puckering of macula, right eye: Secondary | ICD-10-CM | POA: Diagnosis not present

## 2018-10-06 DIAGNOSIS — H43811 Vitreous degeneration, right eye: Secondary | ICD-10-CM | POA: Diagnosis not present

## 2018-10-06 DIAGNOSIS — H35351 Cystoid macular degeneration, right eye: Secondary | ICD-10-CM | POA: Diagnosis not present

## 2018-10-06 DIAGNOSIS — H34831 Tributary (branch) retinal vein occlusion, right eye, with macular edema: Secondary | ICD-10-CM | POA: Diagnosis not present

## 2018-10-13 DIAGNOSIS — L309 Dermatitis, unspecified: Secondary | ICD-10-CM | POA: Diagnosis not present

## 2018-11-17 ENCOUNTER — Encounter: Payer: Self-pay | Admitting: Internal Medicine

## 2018-11-17 ENCOUNTER — Ambulatory Visit: Payer: Medicare PPO | Admitting: Internal Medicine

## 2018-11-17 ENCOUNTER — Other Ambulatory Visit (INDEPENDENT_AMBULATORY_CARE_PROVIDER_SITE_OTHER): Payer: Medicare PPO

## 2018-11-17 VITALS — BP 124/86 | HR 84 | Temp 98.0°F | Ht 66.0 in | Wt 181.0 lb

## 2018-11-17 DIAGNOSIS — E119 Type 2 diabetes mellitus without complications: Secondary | ICD-10-CM

## 2018-11-17 DIAGNOSIS — K219 Gastro-esophageal reflux disease without esophagitis: Secondary | ICD-10-CM

## 2018-11-17 DIAGNOSIS — E785 Hyperlipidemia, unspecified: Secondary | ICD-10-CM

## 2018-11-17 DIAGNOSIS — Z0000 Encounter for general adult medical examination without abnormal findings: Secondary | ICD-10-CM

## 2018-11-17 LAB — BASIC METABOLIC PANEL
BUN: 10 mg/dL (ref 6–23)
CHLORIDE: 102 meq/L (ref 96–112)
CO2: 30 mEq/L (ref 19–32)
Calcium: 9.5 mg/dL (ref 8.4–10.5)
Creatinine, Ser: 1.2 mg/dL (ref 0.40–1.50)
GFR: 75.82 mL/min (ref >60.00)
Glucose, Bld: 88 mg/dL (ref 70–99)
Potassium: 3.4 mEq/L — ABNORMAL LOW (ref 3.5–5.1)
Sodium: 140 mEq/L (ref 135–145)

## 2018-11-17 LAB — HEPATIC FUNCTION PANEL
ALT: 17 U/L (ref 0–53)
AST: 36 U/L (ref 0–37)
Albumin: 4.3 g/dL (ref 3.5–5.2)
Alkaline Phosphatase: 72 U/L (ref 39–117)
Bilirubin, Direct: 0.2 mg/dL (ref 0.0–0.3)
Total Bilirubin: 0.5 mg/dL (ref 0.2–1.2)
Total Protein: 7.7 g/dL (ref 6.0–8.3)

## 2018-11-17 LAB — HEMOGLOBIN A1C: Hgb A1c MFr Bld: 6.9 % — ABNORMAL HIGH (ref 4.6–6.5)

## 2018-11-17 LAB — LIPID PANEL
CHOL/HDL RATIO: 3
Cholesterol: 82 mg/dL (ref 0–200)
HDL: 30.7 mg/dL — ABNORMAL LOW (ref >39.00)
LDL CALC: 37 mg/dL (ref 0–99)
NonHDL: 50.81
Triglycerides: 69 mg/dL (ref 0.0–149.0)
VLDL: 13.8 mg/dL (ref 0.0–40.0)

## 2018-11-17 NOTE — Assessment & Plan Note (Signed)
Ok for change PPi to pepcid qd,  to f/u any worsening symptoms or concerns

## 2018-11-17 NOTE — Assessment & Plan Note (Signed)
stable overall by history and exam, recent data reviewed with pt, and pt to continue medical treatment as before,  to f/u any worsening symptoms or concerns  

## 2018-11-17 NOTE — Progress Notes (Signed)
Subjective:    Patient ID: Jesus Arnold, male    DOB: [birth year], 77 y.o.   MRN: [id number]  HPI  Here to f/u; overall doing ok,  Pt denies chest pain, increasing sob or doe, wheezing, orthopnea, PND, increased LE swelling, palpitations, dizziness or syncope.  Pt denies new neurological symptoms such as new headache, or facial or extremity weakness or numbness.  Pt denies polydipsia, polyuria, or low sugar episode.  Pt states overall good compliance with meds, mostly trying to follow appropriate diet, with wt overall stable,  but little exercise however.  Denies worsening reflux, abd pain, dysphagia, n/v, bowel change or blood, though has had significant gas and diarrhea with PPI and asks for change.   Wt Readings from Last 3 Encounters:  11/17/18 181 lb (82.1 kg)  06/30/18 194 lb (88 kg)  06/30/18 194 lb (88 kg)   Past Medical History:  Diagnosis Date  . Diabetes mellitus without complication ([place])   . GERD (gastroesophageal reflux disease)   . 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  reports that he quit smoking about 21 months ago. He has never used smokeless tobacco. He reports that he does not drink alcohol or use drugs. family history is not on file. No Known Allergies Current Outpatient Medications on File Prior to Visit  Medication Sig Dispense Refill  . blood glucose meter kit and supplies KIT Dispense based on patient and insurance preference. Use up to four times daily as directed. (E11.9). 1 each 0  . clobetasol cream (TEMOVATE) 7.16 % Apply 1 application topically 2 (two) times daily. 30 g 1  . glucose blood (ONE TOUCH ULTRA TEST) test strip Use one strip per test. Test blood sugars 1-4 times daily as instructed. 100 each 12  . Lancets Misc. (ONE TOUCH SURESOFT) MISC Use 1 lancet per test. Test blood sugars 1-4 times per day as instructed. 1 each 1  . metFORMIN (GLUCOPHAGE XR) 500 MG 24 hr tablet Take 3 tablets (1,500 mg total) by mouth daily with  breakfast. 270 tablet 3  . Multiple Vitamin (MULTIVITAMIN) tablet Take 1 tablet by mouth daily.    . pantoprazole (PROTONIX) 40 MG tablet Take 1 tablet (40 mg total) by mouth daily. 90 tablet 3  . rosuvastatin (CRESTOR) 40 MG tablet Take 1 tablet (40 mg total) by mouth daily. 90 tablet 3   No current facility-administered medications on file prior to visit.    Review of Systems  Constitutional: Negative for other unusual diaphoresis or sweats HENT: Negative for ear discharge or swelling Eyes: Negative for other worsening visual disturbances Respiratory: Negative for stridor or other swelling  Gastrointestinal: Negative for worsening distension or other blood Genitourinary: Negative for retention or other urinary change Musculoskeletal: Negative for other MSK pain or swelling Skin: Negative for color change or other new lesions Neurological: Negative for worsening tremors and other numbness  Psychiatric/Behavioral: Negative for worsening agitation or other fatigue All other system neg per pt    Objective:   Physical Exam BP 124/86   Pulse 84   Temp 98 F (36.7 C) (Oral)   Ht 5' 6"  (1.676 m)   Wt 181 lb (82.1 kg)   SpO2 95%   BMI 29.21 kg/m  VS noted,  Constitutional: Pt appears in NAD HENT: Head: NCAT.  Right Ear: External ear normal.  Left Ear: External ear normal.  Eyes: . Pupils are equal, round, and reactive to light. Conjunctivae and EOM are normal Nose: without d/c or  deformity Neck: Neck supple. Gross normal ROM Cardiovascular: Normal rate and regular rhythm.   Pulmonary/Chest: Effort normal and breath sounds without rales or wheezing.  Abd:  Soft, NT, ND, + BS, no organomegaly Neurological: Pt is alert. At baseline orientation, motor grossly intact Skin: Skin is warm. No rashes, other new lesions, no LE edema Psychiatric: Pt behavior is normal without agitation  No other exam findings Lab Results  Component Value Date   WBC 4.9 05/18/2018   HGB 15.2 05/18/2018     HCT 44.0 05/18/2018   PLT 235.0 05/18/2018   GLUCOSE 88 11/17/2018   CHOL 82 11/17/2018   TRIG 69.0 11/17/2018   HDL 30.70 (L) 11/17/2018   LDLCALC 37 11/17/2018   ALT 17 11/17/2018   AST 36 11/17/2018   NA 140 11/17/2018   K 3.4 (L) 11/17/2018   CL 102 11/17/2018   CREATININE 1.20 11/17/2018   BUN 10 11/17/2018   CO2 30 11/17/2018   TSH 1.16 05/18/2018   PSA 0.60 05/18/2018   HGBA1C 6.9 (H) 11/17/2018   MICROALBUR 1.2 05/18/2018        Assessment & Plan:

## 2018-11-17 NOTE — Patient Instructions (Signed)

## 2018-12-01 DIAGNOSIS — H34831 Tributary (branch) retinal vein occlusion, right eye, with macular edema: Secondary | ICD-10-CM | POA: Diagnosis not present

## 2018-12-01 DIAGNOSIS — H43811 Vitreous degeneration, right eye: Secondary | ICD-10-CM | POA: Diagnosis not present

## 2018-12-01 DIAGNOSIS — H35351 Cystoid macular degeneration, right eye: Secondary | ICD-10-CM | POA: Diagnosis not present

## 2018-12-01 DIAGNOSIS — H35371 Puckering of macula, right eye: Secondary | ICD-10-CM | POA: Diagnosis not present

## 2019-01-19 DIAGNOSIS — H34831 Tributary (branch) retinal vein occlusion, right eye, with macular edema: Secondary | ICD-10-CM | POA: Diagnosis not present

## 2019-01-19 DIAGNOSIS — H43811 Vitreous degeneration, right eye: Secondary | ICD-10-CM | POA: Diagnosis not present

## 2019-01-19 DIAGNOSIS — H35351 Cystoid macular degeneration, right eye: Secondary | ICD-10-CM | POA: Diagnosis not present

## 2019-01-19 DIAGNOSIS — H35371 Puckering of macula, right eye: Secondary | ICD-10-CM | POA: Diagnosis not present

## 2019-04-09 ENCOUNTER — Other Ambulatory Visit: Payer: Self-pay | Admitting: Internal Medicine

## 2019-04-18 DIAGNOSIS — L2084 Intrinsic (allergic) eczema: Secondary | ICD-10-CM | POA: Diagnosis not present

## 2019-05-09 ENCOUNTER — Other Ambulatory Visit: Payer: Self-pay | Admitting: Internal Medicine

## 2019-05-23 ENCOUNTER — Other Ambulatory Visit (INDEPENDENT_AMBULATORY_CARE_PROVIDER_SITE_OTHER): Payer: Medicare PPO

## 2019-05-23 ENCOUNTER — Encounter: Payer: Self-pay | Admitting: Internal Medicine

## 2019-05-23 ENCOUNTER — Other Ambulatory Visit: Payer: Self-pay

## 2019-05-23 ENCOUNTER — Ambulatory Visit (INDEPENDENT_AMBULATORY_CARE_PROVIDER_SITE_OTHER): Payer: Medicare PPO | Admitting: Internal Medicine

## 2019-05-23 VITALS — BP 118/78 | HR 88 | Temp 98.1°F | Ht 72.0 in | Wt 180.0 lb

## 2019-05-23 DIAGNOSIS — E538 Deficiency of other specified B group vitamins: Secondary | ICD-10-CM

## 2019-05-23 DIAGNOSIS — E119 Type 2 diabetes mellitus without complications: Secondary | ICD-10-CM | POA: Diagnosis not present

## 2019-05-23 DIAGNOSIS — Z0001 Encounter for general adult medical examination with abnormal findings: Secondary | ICD-10-CM

## 2019-05-23 DIAGNOSIS — E559 Vitamin D deficiency, unspecified: Secondary | ICD-10-CM | POA: Diagnosis not present

## 2019-05-23 DIAGNOSIS — E611 Iron deficiency: Secondary | ICD-10-CM

## 2019-05-23 DIAGNOSIS — E785 Hyperlipidemia, unspecified: Secondary | ICD-10-CM

## 2019-05-23 DIAGNOSIS — R21 Rash and other nonspecific skin eruption: Secondary | ICD-10-CM | POA: Diagnosis not present

## 2019-05-23 LAB — BASIC METABOLIC PANEL
BUN: 9 mg/dL (ref 6–23)
CO2: 29 mEq/L (ref 19–32)
Calcium: 9.7 mg/dL (ref 8.4–10.5)
Chloride: 102 mEq/L (ref 96–112)
Creatinine, Ser: 1.3 mg/dL (ref 0.40–1.50)
GFR: 64.96 mL/min (ref >60.00)
Glucose, Bld: 94 mg/dL (ref 70–99)
Potassium: 4.2 mEq/L (ref 3.5–5.1)
Sodium: 139 mEq/L (ref 135–145)

## 2019-05-23 LAB — CBC WITH DIFFERENTIAL/PLATELET
Basophils Absolute: 0.1 10*3/uL (ref 0.0–0.1)
Basophils Relative: 1.2 % (ref 0.0–3.0)
Eosinophils Absolute: 0.2 10*3/uL (ref 0.0–0.7)
Eosinophils Relative: 3.7 % (ref 0.0–5.0)
HCT: 45.1 % (ref 39.0–52.0)
Hemoglobin: 15.1 g/dL (ref 13.0–17.0)
Lymphocytes Relative: 39 % (ref 12.0–46.0)
Lymphs Abs: 1.9 10*3/uL (ref 0.7–4.0)
MCHC: 33.5 g/dL (ref 30.0–36.0)
MCV: 85.8 fl (ref 78.0–100.0)
Monocytes Absolute: 0.5 10*3/uL (ref 0.1–1.0)
Monocytes Relative: 10.5 % (ref 3.0–12.0)
Neutro Abs: 2.2 10*3/uL (ref 1.4–7.7)
Neutrophils Relative %: 45.6 % (ref 43.0–77.0)
Platelets: 226 10*3/uL (ref 150.0–400.0)
RBC: 5.26 Mil/uL (ref 4.22–5.81)
RDW: 15.8 % — ABNORMAL HIGH (ref 11.5–15.5)
WBC: 4.8 10*3/uL (ref 4.0–10.5)

## 2019-05-23 LAB — PSA: PSA: 0.67 ng/mL (ref 0.10–4.00)

## 2019-05-23 LAB — URINALYSIS, ROUTINE W REFLEX MICROSCOPIC
Bilirubin Urine: NEGATIVE
Ketones, ur: NEGATIVE
Leukocytes,Ua: NEGATIVE
Nitrite: NEGATIVE
Specific Gravity, Urine: >=1.03 — AB (ref 1.000–1.030)
Total Protein, Urine: 100 — AB
Urine Glucose: NEGATIVE
Urobilinogen, UA: 0.2 (ref 0.0–1.0)
pH: 5.5 (ref 5.0–8.0)

## 2019-05-23 LAB — VITAMIN D 25 HYDROXY (VIT D DEFICIENCY, FRACTURES): VITD: 51.96 ng/mL (ref 30.00–100.00)

## 2019-05-23 LAB — HEPATIC FUNCTION PANEL
ALT: 15 U/L (ref 0–53)
AST: 33 U/L (ref 0–37)
Albumin: 4.3 g/dL (ref 3.5–5.2)
Alkaline Phosphatase: 67 U/L (ref 39–117)
Bilirubin, Direct: 0.1 mg/dL (ref 0.0–0.3)
Total Bilirubin: 0.5 mg/dL (ref 0.2–1.2)
Total Protein: 7.1 g/dL (ref 6.0–8.3)

## 2019-05-23 LAB — LIPID PANEL
Cholesterol: 86 mg/dL (ref 0–200)
HDL: 34.1 mg/dL — ABNORMAL LOW (ref >39.00)
LDL Cholesterol: 36 mg/dL (ref 0–99)
NonHDL: 52.21
Total CHOL/HDL Ratio: 3
Triglycerides: 83 mg/dL (ref 0.0–149.0)
VLDL: 16.6 mg/dL (ref 0.0–40.0)

## 2019-05-23 LAB — VITAMIN B12: Vitamin B-12: 464 pg/mL (ref 211–911)

## 2019-05-23 LAB — MICROALBUMIN / CREATININE URINE RATIO
Creatinine,U: 109.4 mg/dL
Microalb Creat Ratio: 19.6 mg/g (ref 0.0–30.0)
Microalb, Ur: 21.4 mg/dL — ABNORMAL HIGH (ref 0.0–1.9)

## 2019-05-23 LAB — IBC PANEL
Iron: 75 ug/dL (ref 42–165)
Saturation Ratios: 20.2 % (ref 20.0–50.0)
Transferrin: 265 mg/dL (ref 212.0–360.0)

## 2019-05-23 LAB — HEMOGLOBIN A1C: Hgb A1c MFr Bld: 6.7 % — ABNORMAL HIGH (ref 4.6–6.5)

## 2019-05-23 LAB — TSH: TSH: 0.98 u[IU]/mL (ref 0.35–4.50)

## 2019-05-23 MED ORDER — CLOBETASOL PROPIONATE 0.05 % EX CREA: 1.0000 "application " | TOPICAL_CREAM | Freq: Two times a day (BID) | CUTANEOUS | 1 refills | Status: DC

## 2019-05-23 NOTE — Progress Notes (Signed)
Subjective:    Patient ID: Jesus Arnold, male    DOB: 1941/01/25, 78 y.o.   MRN: [id number]  HPI  Here for wellness and f/u;  Overall doing ok;  Pt denies Chest pain, worsening SOB, DOE, wheezing, orthopnea, PND, worsening LE edema, palpitations, dizziness or syncope.  Pt denies neurological change such as new headache, facial or extremity weakness.  Pt denies polydipsia, polyuria, or low sugar symptoms. Pt states overall good compliance with treatment and medications, good tolerability, and has been trying to follow appropriate diet.  Pt denies worsening depressive symptoms, suicidal ideation or panic. No fever, night sweats, wt loss, loss of appetite, or other constitutional symptoms.  Pt states good ability with ADL's, has low fall risk, home safety reviewed and adequate, no other significant changes in hearing or vision, and only occasionally active with exercise.  Due for optho   Also asks for refill steroid cream for right groin rash as before, mild, itchy without ulcer, tender, or drainage, No fever and nothing else makes better or worse Past Medical History:  Diagnosis Date  . Diabetes mellitus without complication ([place])   . GERD (gastroesophageal reflux disease)   . 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  reports that he quit smoking about 2 years ago. He has never used smokeless tobacco. He reports that he does not drink alcohol or use drugs. family history is not on file. No Known Allergies Current Outpatient Medications on File Prior to Visit  Medication Sig Dispense Refill  . blood glucose meter kit and supplies KIT Dispense based on patient and insurance preference. Use up to four times daily as directed. (E11.9). 1 each 0  . glucose blood (ONE TOUCH ULTRA TEST) test strip Use one strip per test. Test blood sugars 1-4 times daily as instructed. 100 each 12  . Lancets Misc. (ONE TOUCH SURESOFT) MISC Use 1 lancet per test. Test blood sugars 1-4 times per day as  instructed. 1 each 1  . metFORMIN (GLUCOPHAGE-XR) 500 MG 24 hr tablet TAKE 3 TABLETS (1,500 MG TOTAL) BY MOUTH DAILY WITH BREAKFAST. 270 tablet 1  . Multiple Vitamin (MULTIVITAMIN) tablet Take 1 tablet by mouth daily.    . pantoprazole (PROTONIX) 40 MG tablet Take 1 tablet (40 mg total) by mouth daily. 90 tablet 3  . rosuvastatin (CRESTOR) 40 MG tablet TAKE 1 TABLET BY MOUTH EVERY DAY 90 tablet 3   No current facility-administered medications on file prior to visit.    Review of Systems Constitutional: Negative for other unusual diaphoresis, sweats, appetite or weight changes HENT: Negative for other worsening hearing loss, ear pain, facial swelling, mouth sores or neck stiffness.   Eyes: Negative for other worsening pain, redness or other visual disturbance.  Respiratory: Negative for other stridor or swelling Cardiovascular: Negative for other palpitations or other chest pain  Gastrointestinal: Negative for worsening diarrhea or loose stools, blood in stool, distention or other pain Genitourinary: Negative for hematuria, flank pain or other change in urine volume.  Musculoskeletal: Negative for myalgias or other joint swelling.  Skin: Negative for other color change, or other wound or worsening drainage.  Neurological: Negative for other syncope or numbness. Hematological: Negative for other adenopathy or swelling Psychiatric/Behavioral: Negative for hallucinations, other worsening agitation, SI, self-injury, or new decreased concentration All other system neg per pt    Objective:   Physical Exam BP 118/78   Pulse 88   Temp 98.1 F (36.7 C) (Oral)   Ht 6' (1.829 m)  Wt 180 lb (81.6 kg)   SpO2 98%   BMI 24.41 kg/m  VS noted,  Constitutional: Pt is oriented to person, place, and time. Appears well-developed and well-nourished, in no significant distress and comfortable Head: Normocephalic and atraumatic  Eyes: Conjunctivae and EOM are normal. Pupils are equal, round, and  reactive to light Right Ear: External ear normal without discharge Left Ear: External ear normal without discharge Nose: Nose without discharge or deformity Mouth/Throat: Oropharynx is without other ulcerations and moist  Neck: Normal range of motion. Neck supple. No JVD present. No tracheal deviation present or significant neck LA or mass Cardiovascular: Normal rate, regular rhythm, normal heart sounds and intact distal pulses.   Pulmonary/Chest: WOB normal and breath sounds without rales or wheezing  Abdominal: Soft. Bowel sounds are normal. NT. No HSM  Musculoskeletal: Normal range of motion. Exhibits no edema Lymphadenopathy: Has no other cervical adenopathy.  Neurological: Pt is alert and oriented to person, place, and time. Pt has normal reflexes. No cranial nerve deficit. Motor grossly intact, Gait intact Skin: Skin is warm and dry. + nontender erythem scaly right groin rash noted, no  new ulcerations Psychiatric:  Has normal mood and affect. Behavior is normal without agitation No other exam findings Lab Results  Component Value Date   WBC 4.8 05/23/2019   HGB 15.1 05/23/2019   HCT 45.1 05/23/2019   PLT 226.0 05/23/2019   GLUCOSE 94 05/23/2019   CHOL 86 05/23/2019   TRIG 83.0 05/23/2019   HDL 34.10 (L) 05/23/2019   LDLCALC 36 05/23/2019   ALT 15 05/23/2019   AST 33 05/23/2019   NA 139 05/23/2019   K 4.2 05/23/2019   CL 102 05/23/2019   CREATININE 1.30 05/23/2019   BUN 9 05/23/2019   CO2 29 05/23/2019   TSH 0.98 05/23/2019   PSA 0.67 05/23/2019   HGBA1C 6.7 (H) 05/23/2019   MICROALBUR 21.4 (H) 05/23/2019       Assessment & Plan:

## 2019-05-23 NOTE — Patient Instructions (Addendum)

## 2019-05-26 ENCOUNTER — Encounter: Payer: Self-pay | Admitting: Internal Medicine

## 2019-05-26 NOTE — Assessment & Plan Note (Signed)

## 2019-05-26 NOTE — Assessment & Plan Note (Addendum)
Mild to mod, for topical steroid asd prn,  to f/u any worsening symptoms or concerns  In addition to the time spent performing CPE, I spent an additional 15 minutes face to face,in which greater than 50% of this time was spent in counseling and coordination of care for patient's illness as documented, including the differential dx, treatment, further evaluation and other management of groin rash, DM, HLD

## 2019-05-26 NOTE — Assessment & Plan Note (Signed)
stable overall by history and exam, recent data reviewed with pt, and pt to continue medical treatment as before,  to f/u any worsening symptoms or concerns  

## 2019-06-14 DIAGNOSIS — H5203 Hypermetropia, bilateral: Secondary | ICD-10-CM | POA: Diagnosis not present

## 2019-06-14 DIAGNOSIS — E113211 Type 2 diabetes mellitus with mild nonproliferative diabetic retinopathy with macular edema, right eye: Secondary | ICD-10-CM | POA: Diagnosis not present

## 2019-06-14 DIAGNOSIS — H349 Unspecified retinal vascular occlusion: Secondary | ICD-10-CM | POA: Diagnosis not present

## 2019-06-14 LAB — HM DIABETES EYE EXAM

## 2019-09-12 ENCOUNTER — Ambulatory Visit (INDEPENDENT_AMBULATORY_CARE_PROVIDER_SITE_OTHER): Payer: Medicare PPO | Admitting: *Deleted

## 2019-09-12 DIAGNOSIS — E119 Type 2 diabetes mellitus without complications: Secondary | ICD-10-CM

## 2019-09-12 DIAGNOSIS — Z0000 Encounter for general adult medical examination without abnormal findings: Secondary | ICD-10-CM

## 2019-09-12 MED ORDER — CLOBETASOL PROPIONATE 0.05 % EX CREA: 1.0000 "application " | TOPICAL_CREAM | Freq: Two times a day (BID) | CUTANEOUS | 1 refills | Status: DC

## 2019-09-12 MED ORDER — ONETOUCH SURESOFT LANCING DEV MISC: 1 refills | Status: DC

## 2019-09-12 MED ORDER — GLUCOSE BLOOD VI STRP: ORAL_STRIP | 12 refills | Status: DC

## 2019-09-12 NOTE — Progress Notes (Addendum)
Subjective:   Jesus Arnold is a 78 y.o. male who presents for Medicare Annual/Subsequent preventive examination. I connected with patient by a telephone and verified that I am speaking with the correct person using two identifiers. Patient stated full name and DOB. Patient gave permission to continue with telephonic visit. Patient's location was at home and Nurse's location was at [place] office.   Review of Systems:   Cardiac Risk Factors include: advanced age (>22mn, >>36women);diabetes mellitus;male gender;hypertension Sleep patterns: feels rested on waking, gets up 1-2 times nightly to void and sleeps 8-9 hours nightly.    Home Safety/Smoke Alarms: Feels safe in home. Smoke alarms in place.  Living environment; residence and Firearm Safety: apartment. Lives with  Alone, needs for DME, good support system Seat Belt Safety/Bike Helmet: Wears seat belt.     Objective:    Vitals: There were no vitals taken for this visit.  There is no height or weight on file to calculate BMI.  Advanced Directives 09/12/2019 06/30/2018 12/31/2016  Does Patient Have a Medical Advance Directive? No No No  Would patient like information on creating a medical advance directive? No - Patient declined Yes (ED - Information included in AVS) No - Patient declined    Tobacco Social History   Tobacco Use  Smoking Status Former Smoker  . Quit date: 01/28/2017  . Years since quitting: 2.6  Smokeless Tobacco Never Used     Counseling given: Not Answered  Past Medical History:  Diagnosis Date  . [place] complication ([place]   . GERD (gastroesophageal reflux disease)   . 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 No pertinent surgical history. History reviewed. No pertinent family history. Social History   Socioeconomic History  . Marital status: Single    Spouse name: Not on file  . Number of children: 2  . Years of education: 10  . Highest education level: Not on  file  Occupational History  . Occupation: Retired  SScientific laboratory technician . Financial resource strain: Not hard at all  . Food insecurity    Worry: Never true    Inability: Never true  . Transportation needs    Medical: No    Non-medical: No  Tobacco Use  . Smoking status: Former Smoker    Quit date: 01/28/2017    Years since quitting: 2.6  . Smokeless tobacco: Never Used  Substance and Sexual Activity  . Alcohol use: No  . Drug use: No  . Sexual activity: Not Currently  Lifestyle  . Physical activity    Days per week: 2 days    Minutes per session: 60 min  . Stress: Not at all  Relationships  . Social connections    Talks on phone: More than three times a week    Gets together: More than three times a week    Attends religious service: Not on file    Active member of club or organization: Not on file    Attends meetings of clubs or organizations: Not on file    Relationship status: Not on file  Other Topics Concern  . Not on file  Social History Narrative   Fun/Hobby: Relax; Watch tv, walking.     Outpatient Encounter Medications as of 09/12/2019  Medication Sig  . blood glucose meter kit and supplies KIT Dispense based on patient and insurance preference. Use up to four times daily as directed. (E11.9).  . clobetasol cream (TEMOVATE) 07.90% Apply 1 application  topically 2 (two) times daily.  Marland Kitchen glucose blood (ONE TOUCH ULTRA TEST) test strip Use one strip per test. Test blood sugars 1-4 times daily as instructed.  . Lancets Misc. (ONE TOUCH SURESOFT) MISC Use 1 lancet per test. Test blood sugars 1-4 times per day as instructed.  . metFORMIN (GLUCOPHAGE-XR) 500 MG 24 hr tablet TAKE 3 TABLETS (1,500 MG TOTAL) BY MOUTH DAILY WITH BREAKFAST.  . Multiple Vitamin (MULTIVITAMIN) tablet Take 1 tablet by mouth daily.  . rosuvastatin (CRESTOR) 40 MG tablet TAKE 1 TABLET BY MOUTH EVERY DAY  . pantoprazole (PROTONIX) 40 MG tablet Take 1 tablet (40 mg total) by mouth daily.   No  facility-administered encounter medications on file as of 09/12/2019.     Activities of Daily Living In your present state of health, do you have any difficulty performing the following activities: 09/12/2019  Hearing? N  Vision? N  Difficulty concentrating or making decisions? N  Walking or climbing stairs? N  Dressing or bathing? N  Doing errands, shopping? N  Preparing Food and eating ? N  Using the Toilet? N  In the past six months, have you accidently leaked urine? N  Do you have problems with loss of bowel control? N  Managing your Medications? N  Managing your Finances? N  Housekeeping or managing your Housekeeping? N  Some recent data might be hidden    Patient Care Team: Biagio Borg, MD as PCP - General (Internal Medicine)   Assessment:   This is a routine wellness examination for [name]. Physical assessment deferred to PCP.  Exercise Activities and Dietary recommendations Current Exercise Habits: Home exercise routine, Type of exercise: walking, Time (Minutes): 45, Frequency (Times/Week): 3, Weekly Exercise (Minutes/Week): 135, Exercise limited by: None identified  Diet (meal preparation, eat out, water intake, caffeinated beverages, dairy products, fruits and vegetables): in general, a "healthy" diet  , well balanced   Reviewed heart healthy and diabetic diet. Encouraged patient to increase daily water and healthy fluid intake.  Goals    . Patient Stated     I want to continue to be happy and peaceful. Stay active and enjoy life. Start to monitor diet for sugar and carbohydrates. Drink more water.       Fall Risk Fall Risk  09/12/2019 05/23/2019 06/30/2018 05/18/2018 11/17/2017  Falls in the past year? 0 0 No No Yes  Number falls in past yr: 0 - - - 1  Injury with Fall? 0 - - - No  Risk for fall due to : - - - - -    Depression Screen PHQ 2/9 Scores 09/12/2019 05/23/2019 06/30/2018 05/18/2018  PHQ - 2 Score 0 0 0 0  PHQ- 9 Score - - 0 0    Cognitive Function  MMSE - Mini Mental State Exam 06/30/2018  Orientation to time 5  Orientation to Place 5  Registration 3  Attention/ Calculation 5  Recall 2  Language- name 2 objects 2  Language- repeat 1  Language- follow 3 step command 3  Language- read & follow direction 1  Write a sentence 1  Copy design 1  Total score 29       Ad8 score reviewed for issues:  Issues making decisions: no  Less interest in hobbies / activities: no  Repeats questions, stories (family complaining): no  Trouble using ordinary gadgets (microwave, computer, phone):no  Forgets the month or year: no  Mismanaging finances: no  Remembering appts: no  Daily problems with thinking and/or memory: no  Ad8 score is= 0  Immunization History  Administered Date(s) Administered  . Influenza-Unspecified 09/30/2018  . Pneumococcal Conjugate-13 11/17/2017  . Tdap 11/17/2017   Screening Tests Health Maintenance  Topic Date Due  . PNA vac Low Risk Adult (2 of 2 - PPSV23) 05/22/2020 (Originally 11/17/2018)  . HEMOGLOBIN A1C  11/22/2019  . FOOT EXAM  05/22/2020  . URINE MICROALBUMIN  05/22/2020  . OPHTHALMOLOGY EXAM  06/13/2020  . TETANUS/TDAP  11/18/2027  . INFLUENZA VACCINE  Completed       Plan:    Reviewed health maintenance screenings with patient today and relevant education, vaccines, and/or referrals were provided.   Continue to eat heart healthy diet (full of fruits, vegetables, whole grains, lean protein, water--limit salt, fat, and sugar intake) and increase physical activity as tolerated.  Continue doing brain stimulating activities (puzzles, reading, adult coloring books, staying active) to keep memory sharp.   I have personally reviewed and noted the following in the patient's chart:   . Medical and social history . Use of alcohol, tobacco or illicit drugs  . Current medications and supplements . Functional ability and status . Nutritional status . Physical activity . Advanced directives .  List of other physicians . Screenings to include cognitive, depression, and falls . Referrals and appointments  In addition, I have reviewed and discussed with patient certain preventive protocols, quality metrics, and best practice recommendations. A written personalized care plan for preventive services as well as general preventive health recommendations were provided to patient.     Michiel Cowboy, RN  09/12/2019  Medical screening examination/treatment/procedure(s) were performed by non-physician practitioner and as supervising physician I was immediately available for consultation/collaboration. I agree with above. Cathlean Cower, MD

## 2019-10-18 DIAGNOSIS — L2084 Intrinsic (allergic) eczema: Secondary | ICD-10-CM | POA: Diagnosis not present

## 2019-11-15 ENCOUNTER — Ambulatory Visit: Payer: Medicare PPO | Admitting: Internal Medicine

## 2019-11-17 ENCOUNTER — Other Ambulatory Visit: Payer: Self-pay | Admitting: Internal Medicine

## 2020-01-22 DIAGNOSIS — L218 Other seborrheic dermatitis: Secondary | ICD-10-CM | POA: Diagnosis not present

## 2020-05-05 ENCOUNTER — Other Ambulatory Visit: Payer: Self-pay | Admitting: Internal Medicine

## 2020-05-05 NOTE — Telephone Encounter (Signed)
Please refill as per office routine med refill policy (all routine meds refilled for 3 mo or monthly per pt preference up to one year from last visit, then month to month grace period for 3 mo, then further med refills will have to be denied)  

## 2020-05-07 ENCOUNTER — Other Ambulatory Visit: Payer: Self-pay | Admitting: Internal Medicine

## 2020-05-07 NOTE — Telephone Encounter (Signed)
Please refill as per office routine med refill policy (all routine meds refilled for 3 mo or monthly per pt preference up to one year from last visit, then month to month grace period for 3 mo, then further med refills will have to be denied)  

## 2020-07-19 ENCOUNTER — Other Ambulatory Visit: Payer: Self-pay

## 2020-07-19 ENCOUNTER — Telehealth: Payer: Self-pay

## 2020-07-19 DIAGNOSIS — E119 Type 2 diabetes mellitus without complications: Secondary | ICD-10-CM

## 2020-07-19 MED ORDER — ONE-DAILY MULTI VITAMINS PO TABS: 1.0000 | ORAL_TABLET | Freq: Every day | ORAL | 1 refills | Status: AC

## 2020-07-19 MED ORDER — MOMETASONE FUROATE 0.1 % EX CREA: TOPICAL_CREAM | Freq: Two times a day (BID) | CUTANEOUS | 1 refills | Status: AC

## 2020-07-19 MED ORDER — METFORMIN HCL ER 500 MG PO TB24: 1500.0000 mg | ORAL_TABLET | Freq: Every day | ORAL | 1 refills | Status: DC

## 2020-07-19 MED ORDER — ONETOUCH SURESOFT LANCING DEV MISC: 1 refills | Status: AC

## 2020-07-19 MED ORDER — PANTOPRAZOLE SODIUM 40 MG PO TBEC: 40.0000 mg | DELAYED_RELEASE_TABLET | Freq: Every day | ORAL | 3 refills | Status: AC

## 2020-07-19 MED ORDER — GLUCOSE BLOOD VI STRP: ORAL_STRIP | 12 refills | Status: AC

## 2020-07-19 MED ORDER — ROSUVASTATIN CALCIUM 40 MG PO TABS: 40.0000 mg | ORAL_TABLET | Freq: Every day | ORAL | 3 refills | Status: DC

## 2020-07-19 NOTE — Telephone Encounter (Signed)
1.Medication Requested:  blood glucose meter kit and supplies KIT clobetasol cream (TEMOVATE) 0.05 % glucose blood (ONE TOUCH ULTRA TEST) test strip Lancets Misc. (ONE TOUCH SURESOFT) MISC metFORMIN (GLUCOPHAGE-XR) 500 MG 24 hr tablet Multiple Vitamin (MULTIVITAMIN) tablet pantoprazole (PROTONIX) 40 MG tablet(Expired) rosuvastatin (CRESTOR) 40 MG tablet  2. Pharmacy (Name, Street, [place]): [id number] [place] 3. On Med List: Yes   4. Last Visit with PCP: 6.23.20   5. Next visit date with PCP: n/a   Agent: Please be advised that RX refills may take up to 3 business days. We ask that you follow-up with your pharmacy.

## 2020-07-22 DIAGNOSIS — L218 Other seborrheic dermatitis: Secondary | ICD-10-CM | POA: Diagnosis not present

## 2020-07-31 ENCOUNTER — Other Ambulatory Visit: Payer: Self-pay | Admitting: Internal Medicine

## 2020-09-16 ENCOUNTER — Telehealth: Payer: Self-pay | Admitting: Internal Medicine

## 2020-09-16 ENCOUNTER — Ambulatory Visit: Payer: Medicare PPO

## 2020-09-16 NOTE — Telephone Encounter (Signed)
LVM for pt to rtn my call to r/s appt with [facility]. 

## 2021-01-22 ENCOUNTER — Other Ambulatory Visit: Payer: Self-pay | Admitting: Internal Medicine

## 2021-01-22 NOTE — Telephone Encounter (Signed)
Please refill as per office routine med refill policy (all routine meds refilled for 3 mo or monthly per pt preference up to one year from last visit, then month to month grace period for 3 mo, then further med refills will have to be denied)  

## 2021-01-27 ENCOUNTER — Ambulatory Visit (INDEPENDENT_AMBULATORY_CARE_PROVIDER_SITE_OTHER): Payer: Medicare HMO | Admitting: Internal Medicine

## 2021-01-27 ENCOUNTER — Encounter: Payer: Self-pay | Admitting: Internal Medicine

## 2021-01-27 ENCOUNTER — Other Ambulatory Visit: Payer: Self-pay

## 2021-01-27 VITALS — BP 118/76 | HR 93 | Temp 98.3°F | Ht 72.0 in | Wt 178.0 lb

## 2021-01-27 DIAGNOSIS — Z23 Encounter for immunization: Secondary | ICD-10-CM | POA: Diagnosis not present

## 2021-01-27 DIAGNOSIS — E1165 Type 2 diabetes mellitus with hyperglycemia: Secondary | ICD-10-CM

## 2021-01-27 DIAGNOSIS — Z1159 Encounter for screening for other viral diseases: Secondary | ICD-10-CM | POA: Diagnosis not present

## 2021-01-27 DIAGNOSIS — E785 Hyperlipidemia, unspecified: Secondary | ICD-10-CM

## 2021-01-27 DIAGNOSIS — E559 Vitamin D deficiency, unspecified: Secondary | ICD-10-CM

## 2021-01-27 DIAGNOSIS — E538 Deficiency of other specified B group vitamins: Secondary | ICD-10-CM

## 2021-01-27 DIAGNOSIS — Z0000 Encounter for general adult medical examination without abnormal findings: Secondary | ICD-10-CM | POA: Diagnosis not present

## 2021-01-27 LAB — URINALYSIS, ROUTINE W REFLEX MICROSCOPIC
Bilirubin Urine: NEGATIVE
Ketones, ur: NEGATIVE
Leukocytes,Ua: NEGATIVE
Nitrite: NEGATIVE
Specific Gravity, Urine: >=1.03 — AB (ref 1.000–1.030)
Total Protein, Urine: 100 — AB
Urine Glucose: NEGATIVE
Urobilinogen, UA: 1 (ref 0.0–1.0)
pH: 6 (ref 5.0–8.0)

## 2021-01-27 LAB — CBC WITH DIFFERENTIAL/PLATELET
Basophils Absolute: 0.1 10*3/uL (ref 0.0–0.1)
Basophils Relative: 1.1 % (ref 0.0–3.0)
Eosinophils Absolute: 0.1 10*3/uL (ref 0.0–0.7)
Eosinophils Relative: 1.7 % (ref 0.0–5.0)
HCT: 40.6 % (ref 39.0–52.0)
Hemoglobin: 13.7 g/dL (ref 13.0–17.0)
Lymphocytes Relative: 32.9 % (ref 12.0–46.0)
Lymphs Abs: 1.5 10*3/uL (ref 0.7–4.0)
MCHC: 33.8 g/dL (ref 30.0–36.0)
MCV: 83.9 fl (ref 78.0–100.0)
Monocytes Absolute: 0.5 10*3/uL (ref 0.1–1.0)
Monocytes Relative: 9.8 % (ref 3.0–12.0)
Neutro Abs: 2.6 10*3/uL (ref 1.4–7.7)
Neutrophils Relative %: 54.5 % (ref 43.0–77.0)
Platelets: 220 10*3/uL (ref 150.0–400.0)
RBC: 4.84 Mil/uL (ref 4.22–5.81)
RDW: 17.3 % — ABNORMAL HIGH (ref 11.5–15.5)
WBC: 4.7 10*3/uL (ref 4.0–10.5)

## 2021-01-27 LAB — MICROALBUMIN / CREATININE URINE RATIO
Creatinine,U: 122.4 mg/dL
Microalb Creat Ratio: 18.3 mg/g (ref 0.0–30.0)
Microalb, Ur: 22.4 mg/dL — ABNORMAL HIGH (ref 0.0–1.9)

## 2021-01-27 LAB — HEMOGLOBIN A1C: Hgb A1c MFr Bld: 6.1 % (ref 4.6–6.5)

## 2021-01-27 NOTE — Assessment & Plan Note (Signed)
Age and sex appropriate education and counseling updated with regular exercise and diet Referrals for preventative services - pt states will have eye exam scheduled himself Immunizations addressed - for covid booster soon, flu shot today, declines pneumovax Smoking counseling  - none needed Evidence for depression or other mood disorder - none significant Most recent labs reviewed. I have personally reviewed and have noted: 1) the patient's medical and social history 2) The patient's current medications and supplements 3) The patient's height, weight, and BMI have been recorded in the chart

## 2021-01-27 NOTE — Assessment & Plan Note (Signed)
Lab Results  Component Value Date   HGBA1C 6.1 01/27/2021   Stable, pt to continue current medical treatment metformin  Current Outpatient Medications (Endocrine & Metabolic):  .  metFORMIN (GLUCOPHAGE-XR) 500 MG 24 hr tablet, TAKE 3 TABLETS (1,500 MG TOTAL) BY MOUTH DAILY WITH BREAKFAST.  Current Outpatient Medications (Cardiovascular):  .  rosuvastatin (CRESTOR) 40 MG tablet, Take 1 tablet (40 mg total) by mouth daily.     Current Outpatient Medications (Other):  .  blood glucose meter kit and supplies KIT, Dispense based on patient and insurance preference. Use up to four times daily as directed. (E11.9). Marland Kitchen  glucose blood (ONE TOUCH ULTRA TEST) test strip, Use one strip per test. Test blood sugars 1-4 times daily as instructed. .  Lancets Misc. (ONE TOUCH SURESOFT) MISC, Use 1 lancet per test. Test blood sugars 1-4 times per day as instructed. .  mometasone (ELOCON) 0.1 % cream, Apply topically 2 (two) times daily. .  Multiple Vitamin (MULTIVITAMIN) tablet, Take 1 tablet by mouth daily. .  pantoprazole (PROTONIX) 40 MG tablet, Take 1 tablet (40 mg total) by mouth daily.

## 2021-01-27 NOTE — Assessment & Plan Note (Signed)
Lab Results  Component Value Date   LDLCALC 36 05/23/2019   Stable, pt to continue current statin crestor

## 2021-01-27 NOTE — Patient Instructions (Addendum)
You had the flu shot today  Please continue all other medications as before, and refills have been done if requested.  Please have the pharmacy call with any other refills you may need.  Please continue your efforts at being more active, low cholesterol diet, and weight control.  You are otherwise up to date with prevention measures today.  Please keep your appointments with your specialists as you may have planned  .Please go to the LAB at the blood drawing area for the tests to be done  You will be contacted by phone if any changes need to be made immediately.  Otherwise, you will receive a letter about your results with an explanation, but please check with MyChart first.  Please remember to sign up for MyChart if you have not done so, as this will be important to you in the future with finding out test results, communicating by private email, and scheduling acute appointments online when needed.  Please make an Appointment to return in 6 months, or sooner if needed  

## 2021-01-27 NOTE — Progress Notes (Signed)
Patient ID: Jesus Arnold, male   DOB: Jun 07, 1941, 80 y.o.   MRN: [id number]         Chief Complaint:: wellness exam and HLD, DM       HPI:  Jesus Arnold is a 80 y.o. male here for wellness exam, ok for flu shot, but declines pneumovax.  Plans to get covid booster soon, as well as eye exam though not much has changed from 1 yr ago.                         Also o/w doing well.  No other new complaints. Pt denies chest pain, increased sob or doe, wheezing, orthopnea, PND, increased LE swelling, palpitations, dizziness or syncope.   Pt denies polydipsia, polyuria,   Pt denies fever, wt loss, night sweats, loss of appetite, or other constitutional symptoms Denies other new focal neuro s/s   Wt Readings from Last 3 Encounters:  01/27/21 178 lb (80.7 kg)  05/23/19 180 lb (81.6 kg)  11/17/18 181 lb (82.1 kg)   BP Readings from Last 3 Encounters:  01/27/21 118/76  05/23/19 118/78  11/17/18 124/86   Immunization History  Administered Date(s) Administered  . Fluad Quad(high Dose 65+) 08/28/2019, 01/27/2021  . Influenza, High Dose Seasonal PF 09/30/2018  . Influenza-Unspecified 09/30/2018  . PFIZER(Purple Top)SARS-COV-2 Vaccination 05/14/2020, 06/04/2020  . Pneumococcal Conjugate-13 11/17/2017  . Tdap 11/17/2017   Health Maintenance Due  Topic Date Due  . Hepatitis C Screening  Never done      Past Medical History:  Diagnosis Date  . Diabetes mellitus without complication ([place])   . GERD (gastroesophageal reflux disease)   . 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 No pertinent surgical history.  reports that he quit smoking about 4 years ago. He has never used smokeless tobacco. He reports that he does not drink alcohol and does not use drugs. family history is not on file. No Known Allergies Current Outpatient Medications on File Prior to Visit  Medication Sig Dispense Refill  . blood glucose meter kit and supplies KIT Dispense based on patient and insurance  preference. Use up to four times daily as directed. (E11.9). 1 each 0  . glucose blood (ONE TOUCH ULTRA TEST) test strip Use one strip per test. Test blood sugars 1-4 times daily as instructed. 100 each 12  . Lancets Misc. (ONE TOUCH SURESOFT) MISC Use 1 lancet per test. Test blood sugars 1-4 times per day as instructed. 1 each 1  . metFORMIN (GLUCOPHAGE-XR) 500 MG 24 hr tablet TAKE 3 TABLETS (1,500 MG TOTAL) BY MOUTH DAILY WITH BREAKFAST. 270 tablet 1  . mometasone (ELOCON) 0.1 % cream Apply topically 2 (two) times daily. 45 g 1  . Multiple Vitamin (MULTIVITAMIN) tablet Take 1 tablet by mouth daily. 90 tablet 1  . pantoprazole (PROTONIX) 40 MG tablet Take 1 tablet (40 mg total) by mouth daily. 90 tablet 3  . rosuvastatin (CRESTOR) 40 MG tablet Take 1 tablet (40 mg total) by mouth daily. 90 tablet 3   No current facility-administered medications on file prior to visit.        ROS:  All others reviewed and negative.  Objective        PE:  BP 118/76   Pulse 93   Temp 98.3 F (36.8 C) (Oral)   Ht 6' (1.829 m)   Wt 178 lb (80.7 kg)   SpO2 97%   BMI 24.14 kg/m  Constitutional: Pt appears in NAD               HENT: Head: NCAT.                Right Ear: External ear normal.                 Left Ear: External ear normal.                Eyes: . Pupils are equal, round, and reactive to light. Conjunctivae and EOM are normal               Nose: without d/c or deformity               Neck: Neck supple. Gross normal ROM               Cardiovascular: Normal rate and regular rhythm.                 Pulmonary/Chest: Effort normal and breath sounds without rales or wheezing.                Abd:  Soft, NT, ND, + BS, no organomegaly               Neurological: Pt is alert. At baseline orientation, motor grossly intact               Skin: Skin is warm. No rashes, no other new lesions, LE edema - none               Psychiatric: Pt behavior is normal without agitation   Micro:  none  Cardiac tracings I have personally interpreted today:  none  Pertinent Radiological findings (summarize): none   Lab Results  Component Value Date   WBC 4.7 01/27/2021   HGB 13.7 01/27/2021   HCT 40.6 01/27/2021   PLT 220.0 01/27/2021   GLUCOSE 94 05/23/2019   CHOL 86 05/23/2019   TRIG 83.0 05/23/2019   HDL 34.10 (L) 05/23/2019   LDLCALC 36 05/23/2019   ALT 15 05/23/2019   AST 33 05/23/2019   NA 139 05/23/2019   K 4.2 05/23/2019   CL 102 05/23/2019   CREATININE 1.30 05/23/2019   BUN 9 05/23/2019   CO2 29 05/23/2019   TSH 0.98 05/23/2019   PSA 0.67 05/23/2019   HGBA1C 6.1 01/27/2021   MICROALBUR 22.4 (H) 01/27/2021   Assessment/Plan:  Jesus Arnold is a 80 y.o. Black or African American [2] male with  has a past medical history of Diabetes mellitus without complication ([place]), GERD (gastroesophageal reflux disease), and Neuropathy (11/17/2017).  Preventative health care Age and sex appropriate education and counseling updated with regular exercise and diet Referrals for preventative services - pt states will have eye exam scheduled himself Immunizations addressed - for covid booster soon, flu shot today, declines pneumovax Smoking counseling  - none needed Evidence for depression or other mood disorder - none significant Most recent labs reviewed. I have personally reviewed and have noted: 1) the patient's medical and social history 2) The patient's current medications and supplements 3) The patient's height, weight, and BMI have been recorded in the chart   Dyslipidemia Lab Results  Component Value Date   LDLCALC 36 05/23/2019   Stable, pt to continue current statin crestor  Type 2 diabetes mellitus ([place]) Lab Results  Component Value Date   HGBA1C 6.1 01/27/2021   Stable, pt to continue current medical treatment metformin  Current Outpatient Medications (Endocrine & Metabolic):  .  metFORMIN (GLUCOPHAGE-XR) 500 MG 24 hr tablet, TAKE 3 TABLETS (1,500  MG TOTAL) BY MOUTH DAILY WITH BREAKFAST.  Current Outpatient Medications (Cardiovascular):  .  rosuvastatin (CRESTOR) 40 MG tablet, Take 1 tablet (40 mg total) by mouth daily.     Current Outpatient Medications (Other):  .  blood glucose meter kit and supplies KIT, Dispense based on patient and insurance preference. Use up to four times daily as directed. (E11.9). Marland Kitchen  glucose blood (ONE TOUCH ULTRA TEST) test strip, Use one strip per test. Test blood sugars 1-4 times daily as instructed. .  Lancets Misc. (ONE TOUCH SURESOFT) MISC, Use 1 lancet per test. Test blood sugars 1-4 times per day as instructed. .  mometasone (ELOCON) 0.1 % cream, Apply topically 2 (two) times daily. .  Multiple Vitamin (MULTIVITAMIN) tablet, Take 1 tablet by mouth daily. .  pantoprazole (PROTONIX) 40 MG tablet, Take 1 tablet (40 mg total) by mouth daily.   Followup: Return in about 6 months (around 07/27/2021).  Cathlean Cower, MD 01/27/2021 7:49 PM [facility]

## 2021-01-28 LAB — HEPATIC FUNCTION PANEL
ALT: 15 U/L (ref 0–53)
AST: 34 U/L (ref 0–37)
Albumin: 4 g/dL (ref 3.5–5.2)
Alkaline Phosphatase: 65 U/L (ref 39–117)
Bilirubin, Direct: 0.1 mg/dL (ref 0.0–0.3)
Total Bilirubin: 0.6 mg/dL (ref 0.2–1.2)
Total Protein: 6.7 g/dL (ref 6.0–8.3)

## 2021-01-28 LAB — BASIC METABOLIC PANEL
BUN: 14 mg/dL (ref 6–23)
CO2: 28 mEq/L (ref 19–32)
Calcium: 9.4 mg/dL (ref 8.4–10.5)
Chloride: 105 mEq/L (ref 96–112)
Creatinine, Ser: 1.39 mg/dL (ref 0.40–1.50)
GFR: 48.87 mL/min — ABNORMAL LOW (ref >60.00)
Glucose, Bld: 95 mg/dL (ref 70–99)
Potassium: 3.9 mEq/L (ref 3.5–5.1)
Sodium: 141 mEq/L (ref 135–145)

## 2021-01-28 LAB — PSA: PSA: 0.76 ng/mL (ref 0.10–4.00)

## 2021-01-28 LAB — VITAMIN B12: Vitamin B-12: 285 pg/mL (ref 211–911)

## 2021-01-28 LAB — LIPID PANEL
Cholesterol: 94 mg/dL (ref 0–200)
HDL: 38.2 mg/dL — ABNORMAL LOW (ref >39.00)
LDL Cholesterol: 45 mg/dL (ref 0–99)
NonHDL: 56
Total CHOL/HDL Ratio: 2
Triglycerides: 54 mg/dL (ref 0.0–149.0)
VLDL: 10.8 mg/dL (ref 0.0–40.0)

## 2021-01-28 LAB — HEPATITIS C ANTIBODY
Hepatitis C Ab: NONREACTIVE
SIGNAL TO CUT-OFF: 0.02 (ref <1.00)

## 2021-01-28 LAB — VITAMIN D 25 HYDROXY (VIT D DEFICIENCY, FRACTURES): VITD: 45.83 ng/mL (ref 30.00–100.00)

## 2021-01-28 LAB — TSH: TSH: 1.06 u[IU]/mL (ref 0.35–4.50)

## 2021-01-29 ENCOUNTER — Encounter: Payer: Self-pay | Admitting: Internal Medicine

## 2021-07-18 ENCOUNTER — Other Ambulatory Visit: Payer: Self-pay | Admitting: Internal Medicine

## 2021-07-22 DIAGNOSIS — L218 Other seborrheic dermatitis: Secondary | ICD-10-CM | POA: Diagnosis not present

## 2021-08-04 ENCOUNTER — Telehealth: Payer: Self-pay | Admitting: Internal Medicine

## 2021-08-04 NOTE — Telephone Encounter (Signed)
LVM for pt to rtn my call to schedule AWV with [facility]. Please schedule AWV if pt calls the office  

## 2021-08-05 ENCOUNTER — Other Ambulatory Visit: Payer: Self-pay

## 2021-08-05 ENCOUNTER — Encounter: Payer: Self-pay | Admitting: Internal Medicine

## 2021-08-05 ENCOUNTER — Ambulatory Visit (INDEPENDENT_AMBULATORY_CARE_PROVIDER_SITE_OTHER): Payer: Medicare HMO | Admitting: Internal Medicine

## 2021-08-05 VITALS — BP 116/70 | HR 97 | Temp 99.2°F | Ht 72.0 in | Wt 175.0 lb

## 2021-08-05 DIAGNOSIS — N183 Chronic kidney disease, stage 3 unspecified: Secondary | ICD-10-CM | POA: Insufficient documentation

## 2021-08-05 DIAGNOSIS — E785 Hyperlipidemia, unspecified: Secondary | ICD-10-CM | POA: Diagnosis not present

## 2021-08-05 DIAGNOSIS — N289 Disorder of kidney and ureter, unspecified: Secondary | ICD-10-CM | POA: Diagnosis not present

## 2021-08-05 DIAGNOSIS — E1165 Type 2 diabetes mellitus with hyperglycemia: Secondary | ICD-10-CM | POA: Diagnosis not present

## 2021-08-05 DIAGNOSIS — K219 Gastro-esophageal reflux disease without esophagitis: Secondary | ICD-10-CM | POA: Diagnosis not present

## 2021-08-05 LAB — HEPATIC FUNCTION PANEL
ALT: 18 U/L (ref 0–53)
AST: 37 U/L (ref 0–37)
Albumin: 4.2 g/dL (ref 3.5–5.2)
Alkaline Phosphatase: 53 U/L (ref 39–117)
Bilirubin, Direct: 0.1 mg/dL (ref 0.0–0.3)
Total Bilirubin: 0.6 mg/dL (ref 0.2–1.2)
Total Protein: 7.6 g/dL (ref 6.0–8.3)

## 2021-08-05 LAB — LIPID PANEL
Cholesterol: 97 mg/dL (ref 0–200)
HDL: 40.8 mg/dL (ref >39.00)
LDL Cholesterol: 45 mg/dL (ref 0–99)
NonHDL: 56.3
Total CHOL/HDL Ratio: 2
Triglycerides: 56 mg/dL (ref 0.0–149.0)
VLDL: 11.2 mg/dL (ref 0.0–40.0)

## 2021-08-05 LAB — BASIC METABOLIC PANEL
BUN: 16 mg/dL (ref 6–23)
CO2: 26 mEq/L (ref 19–32)
Calcium: 10 mg/dL (ref 8.4–10.5)
Chloride: 105 mEq/L (ref 96–112)
Creatinine, Ser: 1.72 mg/dL — ABNORMAL HIGH (ref 0.40–1.50)
GFR: 37.71 mL/min — ABNORMAL LOW (ref >60.00)
Glucose, Bld: 78 mg/dL (ref 70–99)
Potassium: 4 mEq/L (ref 3.5–5.1)
Sodium: 140 mEq/L (ref 135–145)

## 2021-08-05 LAB — HEMOGLOBIN A1C: Hgb A1c MFr Bld: 6.3 % (ref 4.6–6.5)

## 2021-08-05 NOTE — Assessment & Plan Note (Signed)
Stable, cont PPI -  to f/u any worsening symptoms or concerns

## 2021-08-05 NOTE — Patient Instructions (Addendum)
Please continue all other medications as before, and refills have been done if requested.  Please have the pharmacy call with any other refills you may need.  Please continue your efforts at being more active, low cholesterol diet, and weight control.  Please keep your appointments with your specialists as you may have planned  You will be contacted regarding the referral for: new EYE DOCTOR (ophthalmology)  We have discussed the Cardiac CT Score test to measure the calcification level (if any) in your heart arteries.  This test has been ordered in our Computer System, so please call [facility] directly, as they prefer this, at [phone] to be scheduled.  Please go to the LAB at the blood drawing area for the tests to be done  You will be contacted by phone if any changes need to be made immediately.  Otherwise, you will receive a letter about your results with an explanation, but please check with MyChart first.  Please remember to sign up for MyChart if you have not done so, as this will be important to you in the future with finding out test results, communicating by private email, and scheduling acute appointments online when needed.  Please make an Appointment to return in 6 months, or sooner if needed

## 2021-08-05 NOTE — Assessment & Plan Note (Signed)
Lab Results  Component Value Date   LDLCALC 45 08/05/2021   Stable, pt to continue current statin crestor 40

## 2021-08-05 NOTE — Assessment & Plan Note (Addendum)
Mild, unclear chronicity, uncontrolled for f/u lab today,  to f/u any worsening symptoms or concerns

## 2021-08-05 NOTE — Progress Notes (Signed)
Patient ID: Jesus Arnold, male   DOB: 1941-07-24, 80 y.o.   MRN: [id number]        Chief Complaint: follow up HLD and hyperglycemia, renal insufficiency, gerd       HPI:  Jesus Arnold is a 80 y.o. male here overall doing ok, Pt denies chest pain, increased sob or doe, wheezing, orthopnea, PND, increased LE swelling, palpitations, dizziness or syncope.   Pt denies polydipsia, polyuria, or new focal neuro s/s   Pt denies fever, wt loss, night sweats, loss of appetite, or other constitutional symptoms  Denies worsening reflux, abd pain, dysphagia, n/v, bowel change or blood. Has lost several lbs with better diet.  Trying to follow lower cholesterol diet  Due for optho f/u but asks for new referal.   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 No pertinent surgical history.  reports that he quit smoking about 4 years ago. His smoking use included cigarettes. He has never used smokeless tobacco. He reports that he does not drink alcohol and does not use drugs. family history is not on file. No Known Allergies Current Outpatient Medications on File Prior to Visit  Medication Sig Dispense Refill   blood glucose meter kit and supplies KIT Dispense based on patient and insurance preference. Use up to four times daily as directed. (E11.9). 1 each 0   glucose blood (ONE TOUCH ULTRA TEST) test strip Use one strip per test. Test blood sugars 1-4 times daily as instructed. 100 each 12   Lancets Misc. (ONE TOUCH SURESOFT) MISC Use 1 lancet per test. Test blood sugars 1-4 times per day as instructed. 1 each 1   metFORMIN (GLUCOPHAGE-XR) 500 MG  24 hr tablet TAKE 3 TABLETS (1,500 MG TOTAL) BY MOUTH DAILY WITH BREAKFAST. 270 tablet 1   mometasone (ELOCON) 0.1 % cream Apply topically 2 (two) times daily. 45 g 1   Multiple Vitamin (MULTIVITAMIN) tablet Take 1 tablet by mouth daily. 90 tablet 1   rosuvastatin (CRESTOR) 40 MG tablet TAKE 1 TABLET BY MOUTH EVERY DAY 90 tablet 3   pantoprazole (PROTONIX) 40 MG tablet Take 1 tablet (40 mg total) by mouth daily. 90 tablet 3   No current facility-administered medications on file prior to visit.        ROS:  All others reviewed and negative.  Objective        PE:  BP 116/70 (BP Location: Right Arm, Patient Position: Sitting, Cuff Size: Normal)   Pulse 97   Temp 99.2 F (37.3 C) (Oral)   Ht 6' (1.829 m)   Wt 175 lb (79.4 kg)   SpO2 97%   BMI 23.73 kg/m                 Constitutional: Pt appears in NAD               HENT: Head: NCAT.                Right Ear: External ear normal.  Left Ear: External ear normal.                Eyes: . Pupils are equal, round, and reactive to light. Conjunctivae and EOM are normal               Nose: without d/c or deformity               Neck: Neck supple. Gross normal ROM               Cardiovascular: Normal rate and regular rhythm.                 Pulmonary/Chest: Effort normal and breath sounds without rales or wheezing.                Abd:  Soft, NT, ND, + BS, no organomegaly               Neurological: Pt is alert. At baseline orientation, motor grossly intact               Skin: Skin is warm. No rashes, no other new lesions, LE edema - none               Psychiatric: Pt behavior is normal without agitation   Micro: none  Cardiac tracings I have personally interpreted today:  none  Pertinent Radiological findings (summarize): none   Lab Results  Component Value Date   WBC 4.7 01/27/2021   HGB 13.7 01/27/2021   HCT 40.6 01/27/2021   PLT 220.0 01/27/2021   GLUCOSE 78 08/05/2021   CHOL 97 08/05/2021   TRIG 56.0 08/05/2021    HDL 40.80 08/05/2021   LDLCALC 45 08/05/2021   ALT 18 08/05/2021   AST 37 08/05/2021   NA 140 08/05/2021   K 4.0 08/05/2021   CL 105 08/05/2021   CREATININE 1.72 (H) 08/05/2021   BUN 16 08/05/2021   CO2 26 08/05/2021   TSH 1.06 01/27/2021   PSA 0.76 01/27/2021   HGBA1C 6.3 08/05/2021   MICROALBUR 22.4 (H) 01/27/2021   Assessment/Plan:  Jesus Arnold is a 80 y.o. Black or African American [2] male with  has a past medical history of Diabetes mellitus without complication ([place]), GERD (gastroesophageal reflux disease), and Neuropathy (11/17/2017).  Type 2 diabetes mellitus ([place]) Lab Results  Component Value Date   HGBA1C 6.3 08/05/2021   Stable, pt to continue current medical treatment metformin   Renal insufficiency Mild, unclear chronicity, uncontrolled for f/u lab today,  to f/u any worsening symptoms or concerns  GERD Stable, cont PPI -  to f/u any worsening symptoms or concerns  Dyslipidemia Lab Results  Component Value Date   LDLCALC 45 08/05/2021   Stable, pt to continue current statin crestor 40  Followup: Return in about 6 months (around 02/02/2022).  Cathlean Cower, MD 08/05/2021 9:45 PM [facility]

## 2021-08-05 NOTE — Assessment & Plan Note (Signed)
Lab Results  Component Value Date   HGBA1C 6.3 08/05/2021   Stable, pt to continue current medical treatment metformin

## 2021-08-09 ENCOUNTER — Encounter: Payer: Self-pay | Admitting: Internal Medicine

## 2021-09-12 DIAGNOSIS — H34239 Retinal artery branch occlusion, unspecified eye: Secondary | ICD-10-CM | POA: Diagnosis not present

## 2021-09-12 DIAGNOSIS — E113393 Type 2 diabetes mellitus with moderate nonproliferative diabetic retinopathy without macular edema, bilateral: Secondary | ICD-10-CM | POA: Diagnosis not present

## 2021-09-12 DIAGNOSIS — H2513 Age-related nuclear cataract, bilateral: Secondary | ICD-10-CM | POA: Diagnosis not present

## 2021-09-12 DIAGNOSIS — H35371 Puckering of macula, right eye: Secondary | ICD-10-CM | POA: Diagnosis not present

## 2022-01-07 ENCOUNTER — Other Ambulatory Visit: Payer: Self-pay | Admitting: Internal Medicine

## 2022-01-07 NOTE — Telephone Encounter (Signed)
Please refill as per office routine med refill policy (all routine meds to be refilled for 3 mo or monthly (per pt preference) up to one year from last visit, then month to month grace period for 3 mo, then further med refills will have to be denied) ? ?

## 2022-01-12 DIAGNOSIS — H35371 Puckering of macula, right eye: Secondary | ICD-10-CM | POA: Diagnosis not present

## 2022-01-12 DIAGNOSIS — E113393 Type 2 diabetes mellitus with moderate nonproliferative diabetic retinopathy without macular edema, bilateral: Secondary | ICD-10-CM | POA: Diagnosis not present

## 2022-01-12 DIAGNOSIS — H34231 Retinal artery branch occlusion, right eye: Secondary | ICD-10-CM | POA: Diagnosis not present

## 2022-01-14 ENCOUNTER — Ambulatory Visit (INDEPENDENT_AMBULATORY_CARE_PROVIDER_SITE_OTHER): Payer: Medicare HMO | Admitting: Ophthalmology

## 2022-01-14 ENCOUNTER — Encounter (INDEPENDENT_AMBULATORY_CARE_PROVIDER_SITE_OTHER): Payer: Self-pay | Admitting: Ophthalmology

## 2022-01-14 ENCOUNTER — Other Ambulatory Visit: Payer: Self-pay

## 2022-01-14 VITALS — BP 139/97 | HR 78

## 2022-01-14 DIAGNOSIS — H35033 Hypertensive retinopathy, bilateral: Secondary | ICD-10-CM

## 2022-01-14 DIAGNOSIS — I10 Essential (primary) hypertension: Secondary | ICD-10-CM

## 2022-01-14 DIAGNOSIS — H25813 Combined forms of age-related cataract, bilateral: Secondary | ICD-10-CM

## 2022-01-14 DIAGNOSIS — E113313 Type 2 diabetes mellitus with moderate nonproliferative diabetic retinopathy with macular edema, bilateral: Secondary | ICD-10-CM

## 2022-01-14 DIAGNOSIS — H34831 Tributary (branch) retinal vein occlusion, right eye, with macular edema: Secondary | ICD-10-CM

## 2022-01-14 MED ORDER — BEVACIZUMAB CHEMO INJECTION 1.25MG/0.05ML SYRINGE FOR KALEIDOSCOPE
1.2500 mg | INTRAVITREAL | Status: AC | PRN
  Administered 2022-01-14: 1.25 mg via INTRAVITREAL

## 2022-01-14 NOTE — Progress Notes (Addendum)
[facility] Note  01/14/2022     CHIEF COMPLAINT Patient presents for Retina Evaluation   HISTORY OF PRESENT ILLNESS: Jesus Arnold is a 81 y.o. male who presents to the clinic today for:   HPI     Retina Evaluation   In right eye.  Onset: unknown.  Duration: unknown.  Associated Symptoms Floaters.  Negative for Flashes, Distortion, Blind Spot, Pain, Redness, Photophobia, Glare, Trauma, Scalp Tenderness, Jaw Claudication, Shoulder/Hip pain, Fever, Weight Loss and Fatigue.  Context:  distance vision, mid-range vision and near vision.  Treatments tried include no treatments.  I, the attending physician,  performed the HPI with the patient and updated documentation appropriately.        Comments   Patient states was at Dr. Richard Arnold office 2 days ago for routine eye exam. Referred for possible BRAO OD. Patient was unaware of any vision changes. 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 BS was 89 this am. Last a1c was 6.3, checked September 2022. Patient states that BP is under good control. Does not take meds for blood pressure. Patient states vision seems good for reading, driving, etc. Patient has had floaters for a long time. No recent increase in floaters. No flashes.      Last edited by Jesus Caffey, MD on 01/14/2022  1:29 PM.    Pt saw Dr. Lucianne Arnold 2 days ago for a routine eye exam, he states he did not know he had anything wrong with his vision other than blurriness he thought came with old age, pt states he used to see Dr. Zadie Arnold, he states last appt with him was about 3 years ago, he states he had 2-3 injections  Referring physician: Lisabeth Pick, MD [address] [place],  [place] [address]  HISTORICAL INFORMATION:   Selected notes from the MEDICAL RECORD NUMBER Referred by Dr. Lucianne Arnold for concern of BRVO LEE:  Ocular Hx- former [facility] pt, last visit 2.20.2020 -- BRVO w/ CME, history of multiple IVA (14) from June 2018 to Feb 2020. PMH-     CURRENT MEDICATIONS: No current outpatient medications on file. (Ophthalmic Drugs)   No current facility-administered medications for this visit. (Ophthalmic Drugs)   Current Outpatient Medications (Other)  Medication Sig   blood glucose meter kit and supplies KIT Dispense based on patient and insurance preference. Use up to four times daily as directed. (E11.9).   glucose blood (ONE TOUCH ULTRA TEST) test strip Use one strip per test. Test blood sugars 1-4 times daily as instructed.   Lancets Misc. (ONE TOUCH SURESOFT) MISC Use 1 lancet per test. Test blood sugars 1-4 times per day as instructed.   metFORMIN (GLUCOPHAGE-XR) 500 MG 24 hr tablet TAKE 3 TABLETS BY MOUTH EVERY DAY WITH BREAKFAST   Multiple Vitamin (MULTIVITAMIN) tablet Take 1 tablet by mouth daily.   rosuvastatin (CRESTOR) 40 MG tablet TAKE 1 TABLET BY MOUTH EVERY DAY   mometasone (ELOCON) 0.1 % cream Apply topically 2 (two) times daily. (Patient not taking: Reported on 01/14/2022)   pantoprazole (PROTONIX) 40 MG tablet Take 1 tablet (40 mg total) by mouth daily.   No current facility-administered medications for this visit. (Other)   REVIEW OF SYSTEMS: ROS   Positive for: Endocrine, Eyes Negative for: Constitutional, Gastrointestinal, Neurological, Skin, Genitourinary, Musculoskeletal, HENT, Cardiovascular, Respiratory, Psychiatric, Allergic/Imm, Heme/Lymph Last edited by Roselee Nova D, COT on 01/14/2022  8:45 AM.     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 No pertinent surgical history.  FAMILY HISTORY History reviewed. No pertinent family history.  SOCIAL HISTORY Social History   Tobacco Use   Smoking status: Former    Types: Cigarettes    Quit date: 01/28/2017    Years since quitting: 4.9   Smokeless  tobacco: Never  Vaping Use   Vaping Use: Never used  Substance Use Topics   Alcohol use: No   Drug use: No       OPHTHALMIC EXAM:  Base Eye Exam     Visual Acuity (Snellen - Linear)       Right Left   Dist cc 20/60 +1 20/20 -2   Dist ph cc 20/40 -1 NI    Correction: Glasses         Tonometry (Tonopen, 8:55 AM)       Right Left   Pressure 17 18         Pupils       Dark Light Shape React APD   Right 2 1.5 Round Minimal None   Left 2 1.5 Round Minimal None         Visual Fields (Counting fingers)       Left Right    Full Full         Extraocular Movement       Right Left    Full, Ortho Full, Ortho         Neuro/Psych     Oriented x3: Yes   Mood/Affect: Normal         Dilation     Both eyes: 1.0% Mydriacyl, 2.5% Phenylephrine @ 8:55 AM           Slit Lamp and Fundus Exam     Slit Lamp Exam       Right Left   Lids/Lashes Dermatochalasis - upper lid, Meibomian gland dysfunction Dermatochalasis - upper lid, Meibomian gland dysfunction   Conjunctiva/Sclera Melanosis Melanosis   Cornea arcus, trace PEE arcus, trace PEE   Anterior Chamber Deep and quiet Deep and quiet   Iris Round and dilated Round and dilated   Lens 2-3+ Nuclear sclerosis, 2-3+ Cortical cataract 2-3+ Nuclear sclerosis, 2-3+ Cortical cataract   Anterior Vitreous Vitreous syneresis, silicone micro bubbles Vitreous syneresis, silicone micro bubbles, Posterior vitreous detachment, vitreous condensations         Fundus Exam       Right Left   Disc Pink and Sharp Pink and Sharp, +cupping, mild PPA   C/D Ratio 0.6 0.7   Macula Flat, Blunted foveal reflex, scattered MA/DBH, +edema IT macula Flat, Good foveal reflex, scattered MA greatest perifovea, mild ERM   Vessels attenuated, Copper wiring, Tortuous, early sclerosis distal IT arcades attenuated, Copper wiring, Tortuous, early sclerosis distal IT arcades   Periphery Attached, rare MA/DBH Attached, rare MA/DBH            Refraction     Wearing Rx       Sphere Cylinder Add   Right +1.75 Sphere +2.75   Left +1.75 Sphere +2.75         Manifest Refraction       Sphere Cylinder Axis Dist VA   Right +1.50 +0.50 180 20/50-2   Left +2.00 +0.50 160 20/20-1            IMAGING AND PROCEDURES  Imaging  and Procedures for 01/14/2022  OCT, Retina - OU - Both Eyes       Right Eye Quality was good. Central Foveal Thickness: 353. Progression has no prior data. Findings include abnormal foveal contour, intraretinal fluid, no SRF, intraretinal hyper-reflective material (IRF/edema IT macula and fovea).   Left Eye Quality was good. Central Foveal Thickness: 273. Progression has no prior data. Findings include normal foveal contour, intraretinal fluid, no SRF (Focal cystic changes inferior fovea and macula).   Notes *Images captured and stored on drive  Diagnosis / Impression:  OD: BRVO w/ CME - IRF/edema IT macula and fovea OS: mild DME -- Focal cystic changes inferior fovea and macula  Clinical management:  See below  Abbreviations: NFP - Normal foveal profile. CME - cystoid macular edema. PED - pigment epithelial detachment. IRF - intraretinal fluid. SRF - subretinal fluid. EZ - ellipsoid zone. ERM - epiretinal membrane. ORA - outer retinal atrophy. ORT - outer retinal tubulation. SRHM - subretinal hyper-reflective material. IRHM - intraretinal hyper-reflective material      Intravitreal Injection, Pharmacologic Agent - OD - Right Eye       Time Out 01/14/2022. 9:28 AM. Confirmed correct patient, procedure, site, and patient consented.   Anesthesia Topical anesthesia was used. Anesthetic medications included Lidocaine 2%, Proparacaine 0.5%.   Procedure Preparation included 5% betadine to ocular surface, eyelid speculum. A supplied needle was used.   Injection: 1.25 mg Bevacizumab 1.53m/0.05ml   Route: Intravitreal, Site: Right Eye   NDC:: 07225-750-51 Lot: [id number] , Expiration  date: 02/11/2022   Post-op Post injection exam found visual acuity of at least counting fingers. The patient tolerated the procedure well. There were no complications. The patient received written and verbal post procedure care education. Post injection medications were not given.            ASSESSMENT/PLAN:    ICD-10-CM   1. Branch retinal vein occlusion of right eye with macular edema  H34.8310 OCT, Retina - OU - Both Eyes    Intravitreal Injection, Pharmacologic Agent - OD - Right Eye    Bevacizumab (AVASTIN) SOLN 1.25 mg    2. Moderate nonproliferative diabetic retinopathy of both eyes with macular edema associated with type 2 diabetes mellitus (HPeever  [address]    3. Essential hypertension  I10     4. Hypertensive retinopathy of both eyes  H35.033     5. Combined forms of age-related cataract of both eyes  H25.813      BRVO w/ CME OD - former pt of Dr. [id number]-- history of IVA OD x14 from June 2018 to Feb 2020 -- then lost to retina f/u - The natural history of retinal vein occlusion and macular edema and treatment options including observation, laser photocoagulation, and intravitreal antiVEGF injection with Avastin and Lucentis and Eylea and intravitreal injection of steroids with triamcinolone and Ozurdex and the complications of these procedures including loss of vision, infection, cataract, glaucoma, and retinal detachment were discussed with patient. - Specifically [place] regarding patient stabilization with anti-VEGF agents and increased potential for visual improvements.  Also discussed need for frequent follow up and potentially multiple injections given the chronic nature of the disease process - BCVA 20/40 OD - OCT shows IRF/edema IT macula and fovea - recommend IVA OD #15 today, 02.15.23 - pt wishes to proceed with injection - RBA of procedure discussed, questions answered - informed consent obtained and signed - see procedure  note - F/U 4 weeks --  DFE/OCT/FA - transit OD/possible injection  2. Mild Non-proliferative diabetic retinopathy, both eyes  - A1c: 6.3 on 08/05/21 - The incidence, risk factors for progression, natural history and treatment options for diabetic retinopathy were discussed with patient.   - The need for close monitoring of blood glucose, blood pressure, and serum lipids, avoiding cigarette or any type of tobacco, and the need for long term follow up was also discussed with patient. - exam show scattered MA/DBH OU - OCT shows diabetic macular edema, both eyes  The natural history, pathology, and characteristics of diabetic macular edema discussed with patient.  A generalized discussion of the major clinical trials concerning treatment of diabetic macular edema (ETDRS, DCT, SCORE, RISE / RIDE, and ongoing DRCR net studies) was completed.  This discussion included mention of the various approaches to treating diabetic macular edema (observation, laser photocoagulation, anti-VEGF injections with lucentis / Avastin / Eylea, steroid injections with Kenalog / Ozurdex, and intraocular surgery with vitrectomy).  The goal hemoglobin A1C of 6-7 was discussed, as well as importance of smoking cessation and hypertension control.  Need for ongoing treatment and monitoring were specifically discussed with reference to chronic nature of diabetic macular edema. - f/u in 4 wks -- DFE/OCT, possible injection  3,4. Hypertensive retinopathy OU - discussed importance of tight BP control - monitor  5. Mixed Cataract OU - The symptoms of cataract, surgical options, and treatments and risks were discussed with patient. - discussed diagnosis and progression - monitor  Ophthalmic Meds Ordered this visit:  Meds ordered this encounter  Medications   Bevacizumab (AVASTIN) SOLN 1.25 mg     Return in about 4 weeks (around 02/11/2022) for f/u BRVO OD, DFE, OCT, FA.  There are no Patient Instructions on file for this  visit.   Explained the diagnoses, plan, and follow up with the patient and they expressed understanding.  Patient expressed understanding of the importance of proper follow up care.   This document serves as a record of services personally performed by Gardiner Sleeper, MD, PhD. It was created on their behalf by San Jetty. Owens Shark, OA an ophthalmic technician. The creation of this record is the provider's dictation and/or activities during the visit.    Electronically signed by: San Jetty. Owens Shark, [place] 02.15.2023 1:48 PM   Gardiner Sleeper, M.D., Ph.D. Diseases & Surgery of the Retina and Vitreous [facility]  I have reviewed the above documentation for accuracy and completeness, and I agree with the above. Gardiner Sleeper, M.D., Ph.D. 01/14/22 1:48 PM  Abbreviations: M myopia (nearsighted); A astigmatism; H hyperopia (farsighted); P presbyopia; Mrx spectacle prescription;  CTL contact lenses; OD right eye; OS left eye; OU both eyes  XT exotropia; ET esotropia; PEK punctate epithelial keratitis; PEE punctate epithelial erosions; DES dry eye syndrome; MGD meibomian gland dysfunction; ATs artificial tears; PFAT's preservative free artificial tears; [place] nuclear sclerotic cataract; PSC posterior subcapsular cataract; ERM epi-retinal membrane; PVD posterior vitreous detachment; RD retinal detachment; DM diabetes mellitus; DR diabetic retinopathy; NPDR non-proliferative diabetic retinopathy; PDR proliferative diabetic retinopathy; CSME clinically significant macular edema; DME diabetic macular edema; dbh dot blot hemorrhages; CWS cotton wool spot; POAG primary open angle glaucoma; C/D cup-to-disc ratio; HVF humphrey visual field; GVF goldmann visual field; OCT optical coherence tomography; IOP intraocular pressure; BRVO Branch retinal vein occlusion; CRVO central retinal vein occlusion; CRAO central retinal artery occlusion; BRAO branch retinal artery occlusion; RT retinal tear; SB scleral  buckle; PPV pars plana vitrectomy; VH Vitreous hemorrhage;  PRP panretinal laser photocoagulation; IVK intravitreal kenalog; VMT vitreomacular traction; MH Macular hole;  NVD neovascularization of the disc; NVE neovascularization elsewhere; AREDS age related eye disease study; ARMD age related macular degeneration; POAG primary open angle glaucoma; EBMD epithelial/anterior basement membrane dystrophy; ACIOL anterior chamber intraocular lens; IOL intraocular lens; PCIOL posterior chamber intraocular lens; Phaco/IOL phacoemulsification with intraocular lens placement; [place] photorefractive keratectomy; LASIK laser assisted in situ keratomileusis; HTN hypertension; DM diabetes mellitus; COPD chronic obstructive pulmonary disease

## 2022-02-03 ENCOUNTER — Ambulatory Visit (INDEPENDENT_AMBULATORY_CARE_PROVIDER_SITE_OTHER): Payer: Medicare HMO | Admitting: Internal Medicine

## 2022-02-03 ENCOUNTER — Encounter: Payer: Self-pay | Admitting: Internal Medicine

## 2022-02-03 ENCOUNTER — Other Ambulatory Visit: Payer: Self-pay

## 2022-02-03 VITALS — BP 126/70 | HR 83 | Temp 97.9°F | Ht 72.0 in

## 2022-02-03 DIAGNOSIS — E538 Deficiency of other specified B group vitamins: Secondary | ICD-10-CM | POA: Diagnosis not present

## 2022-02-03 DIAGNOSIS — N289 Disorder of kidney and ureter, unspecified: Secondary | ICD-10-CM | POA: Diagnosis not present

## 2022-02-03 DIAGNOSIS — Z0001 Encounter for general adult medical examination with abnormal findings: Secondary | ICD-10-CM

## 2022-02-03 DIAGNOSIS — E559 Vitamin D deficiency, unspecified: Secondary | ICD-10-CM

## 2022-02-03 DIAGNOSIS — E1165 Type 2 diabetes mellitus with hyperglycemia: Secondary | ICD-10-CM | POA: Diagnosis not present

## 2022-02-03 DIAGNOSIS — K219 Gastro-esophageal reflux disease without esophagitis: Secondary | ICD-10-CM | POA: Diagnosis not present

## 2022-02-03 DIAGNOSIS — E785 Hyperlipidemia, unspecified: Secondary | ICD-10-CM

## 2022-02-03 MED ORDER — ATORVASTATIN CALCIUM 80 MG PO TABS: 80.0000 mg | ORAL_TABLET | Freq: Every day | ORAL | 3 refills | Status: DC

## 2022-02-03 NOTE — Progress Notes (Shared)
[facility] Note  02/11/2022     CHIEF COMPLAINT Patient presents for No chief complaint on file.   HISTORY OF PRESENT ILLNESS: Jesus Arnold is a 81 y.o. male who presents to the clinic today for:    Referring physician: Biagio Borg, MD [place],  [place] [id number]  HISTORICAL INFORMATION:   Selected notes from the MEDICAL RECORD NUMBER Referred by Dr. [name] for concern of BRVO LEE:  Ocular Hx- former [facility] pt, last visit 2.20.2020 -- BRVO w/ CME, history of multiple IVA (14) from June 2018 to Feb 2020. PMH-    CURRENT MEDICATIONS: No current outpatient medications on file. (Ophthalmic Drugs)   No current facility-administered medications for this visit. (Ophthalmic Drugs)   Current Outpatient Medications (Other)  Medication Sig   atorvastatin (LIPITOR) 80 MG tablet Take 1 tablet (80 mg total) by mouth daily.   blood glucose meter kit and supplies KIT Dispense based on patient and insurance preference. Use up to four times daily as directed. (E11.9).   glucose blood (ONE TOUCH ULTRA TEST) test strip Use one strip per test. Test blood sugars 1-4 times daily as instructed.   Lancets Misc. (ONE TOUCH SURESOFT) MISC Use 1 lancet per test. Test blood sugars 1-4 times per day as instructed.   metFORMIN (GLUCOPHAGE-XR) 500 MG 24 hr tablet TAKE 3 TABLETS BY MOUTH EVERY DAY WITH BREAKFAST   mometasone (ELOCON) 0.1 % cream Apply topically 2 (two) times daily.   Multiple Vitamin (MULTIVITAMIN) tablet Take 1 tablet by mouth daily.   pantoprazole (PROTONIX) 40 MG tablet Take 1 tablet (40 mg total) by mouth daily. (Patient not taking: Reported on 02/03/2022)   rosuvastatin (CRESTOR) 40 MG tablet TAKE 1 TABLET BY MOUTH EVERY DAY   No current facility-administered medications for this visit. 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  FAMILY HISTORY No family history on file.  SOCIAL HISTORY Social History   Tobacco Use   Smoking status: Former    Types: Cigarettes    Quit date: 01/28/2017    Years since quitting: 5.0   Smokeless tobacco: Never  Vaping Use   Vaping Use: Never used  Substance Use Topics   Alcohol use: No   Drug use: No       OPHTHALMIC EXAM:  Not recorded     IMAGING AND PROCEDURES  Imaging and Procedures for 02/11/2022          ASSESSMENT/PLAN:  No diagnosis found.  BRVO w/ CME OD - former pt of Dr. [name] -- history of IVA OD x14 from June 2018 to Feb 2020 -- then lost to retina f/u - The natural history of retinal vein occlusion and macular edema and treatment options including observation, laser photocoagulation, and intravitreal antiVEGF injection with Avastin and Lucentis and Eylea and intravitreal injection of steroids with triamcinolone and Ozurdex and the complications of these procedures including loss of vision, infection, cataract, glaucoma, and retinal detachment were discussed with patient. - Specifically discussed findings from [place] / [place] study regarding patient stabilization with anti-VEGF agents and increased potential for visual improvements.  Also discussed need for frequent follow up and potentially multiple injections given the chronic nature  of the disease process - BCVA 20/40 OD - OCT shows IRF/edema IT macula and fovea - s/p IVA OD #15 (02.15.23) - recommend IVA OD #16 today, 03.15.23 - pt wishes to proceed with injection - RBA of procedure discussed, questions answered - informed consent obtained and signed - see procedure note - F/U 4 weeks -- DFE/OCT/FA - transit OD/possible injection  2. Mild Non-proliferative diabetic retinopathy, both eyes  - A1c: 6.3 on 09.06.22 - The incidence, risk factors for  progression, natural history and treatment options for diabetic retinopathy were discussed with patient.   - The need for close monitoring of blood glucose, blood pressure, and serum lipids, avoiding cigarette or any type of tobacco, and the need for long term follow up was also discussed with patient. - exam show scattered MA/DBH OU - OCT shows diabetic macular edema, both eyes  The natural history, pathology, and characteristics of diabetic macular edema discussed with patient.  A generalized discussion of the major clinical trials concerning treatment of diabetic macular edema (ETDRS, DCT, SCORE, RISE / RIDE, and ongoing DRCR net studies) was completed.  This discussion included mention of the various approaches to treating diabetic macular edema (observation, laser photocoagulation, anti-VEGF injections with lucentis / Avastin / Eylea, steroid injections with Kenalog / Ozurdex, and intraocular surgery with vitrectomy).  The goal hemoglobin A1C of 6-7 was discussed, as well as importance of smoking cessation and hypertension control.  Need for ongoing treatment and monitoring were specifically discussed with reference to chronic nature of diabetic macular edema. - f/u in 4 wks -- DFE/OCT, possible injection  3,4. Hypertensive retinopathy OU - discussed importance of tight BP control - monitor  5. Mixed Cataract OU - The symptoms of cataract, surgical options, and treatments and risks were discussed with patient. - discussed diagnosis and progression - monitor  Ophthalmic Meds Ordered this visit:  No orders of the defined types were placed in this encounter.    No follow-ups on file.  There are no Patient Instructions on file for this visit.   Explained the diagnoses, plan, and follow up with the patient and they expressed understanding.  Patient expressed understanding of the importance of proper follow up care.   This document serves as a record of services personally performed by  Gardiner Sleeper, MD, PhD. It was created on their behalf by Orvan Falconer, an ophthalmic technician. The creation of this record is the provider's dictation and/or activities during the visit.    Electronically signed by: Orvan Falconer, OA, 02/03/22  10:42 AM    Gardiner Sleeper, M.D., Ph.D. Diseases & Surgery of the Retina and Vitreous [facility]  I have reviewed the above documentation for accuracy and completeness, and I agree with the above. Gardiner Sleeper, M.D., Ph.D. 01/14/22 10:42 AM  Abbreviations: M myopia (nearsighted); A astigmatism; H hyperopia (farsighted); P presbyopia; Mrx spectacle prescription;  CTL contact lenses; OD right eye; OS left eye; OU both eyes  XT exotropia; ET esotropia; PEK punctate epithelial keratitis; PEE punctate epithelial erosions; DES dry eye syndrome; MGD meibomian gland dysfunction; ATs artificial tears; PFAT's preservative free artificial tears; [place] nuclear sclerotic cataract; PSC posterior subcapsular cataract; ERM epi-retinal membrane; PVD posterior vitreous detachment; RD retinal detachment; DM diabetes mellitus; DR diabetic retinopathy; NPDR non-proliferative diabetic retinopathy; PDR proliferative diabetic retinopathy; CSME clinically significant macular edema; DME diabetic macular edema; dbh dot blot hemorrhages; CWS cotton wool spot; POAG primary open angle glaucoma; C/D cup-to-disc ratio; HVF humphrey visual field;  GVF goldmann visual field; OCT optical coherence tomography; IOP intraocular pressure; BRVO Branch retinal vein occlusion; CRVO central retinal vein occlusion; CRAO central retinal artery occlusion; BRAO branch retinal artery occlusion; RT retinal tear; SB scleral buckle; PPV pars plana vitrectomy; VH Vitreous hemorrhage; PRP panretinal laser photocoagulation; IVK intravitreal kenalog; VMT vitreomacular traction; MH Macular hole;  NVD neovascularization of the disc; NVE neovascularization elsewhere; AREDS age  related eye disease study; ARMD age related macular degeneration; POAG primary open angle glaucoma; EBMD epithelial/anterior basement membrane dystrophy; ACIOL anterior chamber intraocular lens; IOL intraocular lens; PCIOL posterior chamber intraocular lens; Phaco/IOL phacoemulsification with intraocular lens placement; [place] photorefractive keratectomy; LASIK laser assisted in situ keratomileusis; HTN hypertension; DM diabetes mellitus; COPD chronic obstructive pulmonary disease

## 2022-02-03 NOTE — Progress Notes (Signed)
Patient ID: Jesus Arnold, male   DOB: 02-Dec-1940, 81 y.o.   MRN: [id number] ? ? ? ?     Chief Complaint:: wellness exam and hld, dm, renal slowing, gerd ? ?     HPI:  Jesus Arnold is a 81 y.o. male here for wellness exam; declines covid booster, fu shot, shingrix, and pneumovax, but for eye referral o/w up to date ?         ?              Also not taking the crestor due to GI effects with more frequent BM and accidents sometimes.  None since stopped med 2 wks ago. Denies worsening reflux, abd pain, dysphagia, n/v, bowel change or blood.  Pt denies chest pain, increased sob or doe, wheezing, orthopnea, PND, increased LE swelling, palpitations, dizziness or syncope.   Pt denies polydipsia, polyuria, or new focal neuro s/s.  Pt denies fever, wt loss, night sweats, loss of appetite, or other constitutional symptoms  No other new complaints ?  ?Wt Readings from Last 3 Encounters:  ?08/05/21 175 lb (79.4 kg)  ?01/27/21 178 lb (80.7 kg)  ?05/23/19 180 lb (81.6 kg)  ? ?BP Readings from Last 3 Encounters:  ?02/03/22 126/70  ?01/14/22 (!) 139/97  ?08/05/21 116/70  ? ?Immunization History  ?Administered Date(s) Administered  ? Fluad Quad(high Dose 65+) 08/28/2019, 01/27/2021  ? Influenza, High Dose Seasonal PF 09/30/2018  ? Influenza-Unspecified 09/30/2018  ? PFIZER(Purple Top)SARS-COV-2 Vaccination 05/14/2020, 06/04/2020, 03/05/2021  ? Pneumococcal Conjugate-13 11/17/2017, 05/09/2020  ? Tdap 11/17/2017  ? ?Health Maintenance Due  ?Topic Date Due  ? OPHTHALMOLOGY EXAM  06/13/2020  ? URINE MICROALBUMIN  01/27/2022  ? HEMOGLOBIN A1C  02/02/2022  ? ?  ? ?Past Medical History:  ?Diagnosis Date  ? Diabetes mellitus without complication ([place])   ? GERD (gastroesophageal reflux disease)   ? 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 ?History reviewed. No pertinent surgical history. ? reports that he quit smoking about 5 years ago. His smoking use included cigarettes. He has never used smokeless tobacco. He reports that he does not  drink alcohol and does not use drugs. ?family history is not on file. ?No Known Allergies ?Current Outpatient Medications on File Prior to Visit  ?Medication Sig Dispense Refill  ? blood glucose meter kit and supplies KIT Dispense based on patient and insurance preference. Use up to four times daily as directed. (E11.9). 1 each 0  ? glucose blood (ONE TOUCH ULTRA TEST) test strip Use one strip per test. Test blood sugars 1-4 times daily as instructed. 100 each 12  ? Lancets Misc. (ONE TOUCH SURESOFT) MISC Use 1 lancet per test. Test blood sugars 1-4 times per day as instructed. 1 each 1  ? metFORMIN (GLUCOPHAGE-XR) 500 MG 24 hr tablet TAKE 3 TABLETS BY MOUTH EVERY DAY WITH BREAKFAST 270 tablet 1  ? mometasone (ELOCON) 0.1 % cream Apply topically 2 (two) times daily. 45 g 1  ? Multiple Vitamin (MULTIVITAMIN) tablet Take 1 tablet by mouth daily. 90 tablet 1  ? rosuvastatin (CRESTOR) 40 MG tablet TAKE 1 TABLET BY MOUTH EVERY DAY 90 tablet 3  ? pantoprazole (PROTONIX) 40 MG tablet Take 1 tablet (40 mg total) by mouth daily. (Patient not taking: Reported on 02/03/2022) 90 tablet 3  ? ?No current facility-administered medications on file prior to visit.  ? ?     ROS:  All others reviewed and negative. ? ?Objective  ? ?  PE:  BP 126/70 (BP Location: Right Arm, Patient Position: Sitting, Cuff Size: Large)   Pulse 83   Temp 97.9 ?F (36.6 ?C) (Oral)   Ht 6' (1.829 m)   SpO2 98%   BMI 23.73 kg/m?  ? ?              Constitutional: Pt appears in NAD ?              HENT: Head: NCAT.  ?              Right Ear: External ear normal.   ?              Left Ear: External ear normal.  ?              Eyes: . Pupils are equal, round, and reactive to light. Conjunctivae and EOM are normal ?              Nose: without d/c or deformity ?              Neck: Neck supple. Gross normal ROM ?              Cardiovascular: Normal rate and regular rhythm.   ?              Pulmonary/Chest: Effort normal and breath sounds without rales or  wheezing.  ?              Abd:  Soft, NT, ND, + BS, no organomegaly ?              Neurological: Pt is alert. At baseline orientation, motor grossly intact ?              Skin: Skin is warm. No rashes, no other new lesions, LE edema - none ?              Psychiatric: Pt behavior is normal without agitation  ? ?Micro: none ? ?Cardiac tracings I have personally interpreted today:  none ? ?Pertinent Radiological findings (summarize): none  ? ?Lab Results  ?Component Value Date  ? WBC 4.7 01/27/2021  ? HGB 13.7 01/27/2021  ? HCT 40.6 01/27/2021  ? PLT 220.0 01/27/2021  ? GLUCOSE 78 08/05/2021  ? CHOL 97 08/05/2021  ? TRIG 56.0 08/05/2021  ? HDL 40.80 08/05/2021  ? LDLCALC 45 08/05/2021  ? ALT 18 08/05/2021  ? AST 37 08/05/2021  ? NA 140 08/05/2021  ? K 4.0 08/05/2021  ? CL 105 08/05/2021  ? CREATININE 1.72 (H) 08/05/2021  ? BUN 16 08/05/2021  ? CO2 26 08/05/2021  ? TSH 1.06 01/27/2021  ? PSA 0.76 01/27/2021  ? HGBA1C 6.3 08/05/2021  ? MICROALBUR 22.4 (H) 01/27/2021  ? ?Assessment/Plan:  ?Jesus Arnold is a 81 y.o. Black or African American [2] male with  has a past medical history of Diabetes mellitus without complication ([place]), GERD (gastroesophageal reflux disease), and Neuropathy (11/17/2017). ? ?Encounter for well adult exam with abnormal findings ?Age and sex appropriate education and counseling updated with regular exercise and diet ?Referrals for preventative services - for eye exam ?Immunizations addressed - decline covid booster, flu shot, shingrix, pneumovax ?Smoking counseling  - none needed ?Evidence for depression or other mood disorder - none significant ?Most recent labs reviewed. ?I have personally reviewed and have noted: ?1) the patient's medical and social history ?2) The patient's current medications and supplements ?3) The patient's height, weight, and BMI have been recorded in the chart ? ? ?  Type 2 diabetes mellitus ([place]) ?Lab Results  ?Component Value Date  ? HGBA1C 6.3 08/05/2021  ? ?Stable, pt  to continue current medical treatment metformin ? ? ?Renal insufficiency ?Lab Results  ?Component Value Date  ? CREATININE 1.72 (H) 08/05/2021  ? ?Slight worsening overall, cont to avoid nephrotoxins and current med tx ? ?GERD ?Stable, cont PPI,  to f/u any worsening symptoms or concerns ? ?Dyslipidemia ?Lab Results  ?Component Value Date  ? LDLCALC 45 08/05/2021  ? ?Stable LDL but with diarrhea GI statin side effect, pt to change to lipitor 80 ? ?Followup: Return in about 6 months (around 08/06/2022). ? ?Cathlean Cower, MD 02/08/2022 8:58 PM ?[facility] ?Internal Medicine ?

## 2022-02-03 NOTE — Patient Instructions (Addendum)
Ok to change the Crestor (rosuvastatin) to the Lipitor  ? ?Please continue all other medications as before, and refills have been done if requested. ? ?Please have the pharmacy call with any other refills you may need. ? ?Please continue your efforts at being more active, low cholesterol diet, and weight control. ? ?You are otherwise up to date with prevention measures today. ? ?Please keep your appointments with your specialists as you may have planned ? ?Please go to the LAB at the blood drawing area for the tests to be done ? ?You will be contacted by phone if any changes need to be made immediately.  Otherwise, you will receive a letter about your results with an explanation, but please check with MyChart first. ? ?Please remember to sign up for MyChart if you have not done so, as this will be important to you in the future with finding out test results, communicating by private email, and scheduling acute appointments online when needed. ? ?Please make an Appointment to return in 6 months, or sooner if needed ? ? ?

## 2022-02-08 ENCOUNTER — Encounter: Payer: Self-pay | Admitting: Internal Medicine

## 2022-02-08 NOTE — Assessment & Plan Note (Signed)
Lab Results  Component Value Date   HGBA1C 6.3 08/05/2021   Stable, pt to continue current medical treatment metformin  

## 2022-02-08 NOTE — Assessment & Plan Note (Signed)
Age and sex appropriate education and counseling updated with regular exercise and diet ?Referrals for preventative services - for eye exam ?Immunizations addressed - decline covid booster, flu shot, shingrix, pneumovax ?Smoking counseling  - none needed ?Evidence for depression or other mood disorder - none significant ?Most recent labs reviewed. ?I have personally reviewed and have noted: ?1) the patient's medical and social history ?2) The patient's current medications and supplements ?3) The patient's height, weight, and BMI have been recorded in the chart ? ?

## 2022-02-08 NOTE — Assessment & Plan Note (Signed)
Lab Results  ?Component Value Date  ? LDLCALC 45 08/05/2021  ? ?Stable LDL but with diarrhea GI statin side effect, pt to change to lipitor 80 ? ?

## 2022-02-08 NOTE — Assessment & Plan Note (Signed)
Stable, cont PPI,  to f/u any worsening symptoms or concerns 

## 2022-02-08 NOTE — Assessment & Plan Note (Addendum)
Lab Results  ?Component Value Date  ? CREATININE 1.72 (H) 08/05/2021  ? ?Slight worsening overall, cont to avoid nephrotoxins and current med tx ?

## 2022-02-11 ENCOUNTER — Encounter (INDEPENDENT_AMBULATORY_CARE_PROVIDER_SITE_OTHER): Payer: Medicare HMO | Admitting: Ophthalmology

## 2022-02-11 ENCOUNTER — Encounter (INDEPENDENT_AMBULATORY_CARE_PROVIDER_SITE_OTHER): Payer: Self-pay

## 2022-02-11 DIAGNOSIS — H34831 Tributary (branch) retinal vein occlusion, right eye, with macular edema: Secondary | ICD-10-CM

## 2022-02-11 DIAGNOSIS — I10 Essential (primary) hypertension: Secondary | ICD-10-CM

## 2022-02-11 DIAGNOSIS — E113313 Type 2 diabetes mellitus with moderate nonproliferative diabetic retinopathy with macular edema, bilateral: Secondary | ICD-10-CM

## 2022-02-11 DIAGNOSIS — H25813 Combined forms of age-related cataract, bilateral: Secondary | ICD-10-CM

## 2022-02-11 DIAGNOSIS — H35033 Hypertensive retinopathy, bilateral: Secondary | ICD-10-CM

## 2022-03-02 ENCOUNTER — Ambulatory Visit (INDEPENDENT_AMBULATORY_CARE_PROVIDER_SITE_OTHER): Payer: Medicare HMO

## 2022-03-02 DIAGNOSIS — Z0000 Encounter for general adult medical examination without abnormal findings: Secondary | ICD-10-CM | POA: Diagnosis not present

## 2022-03-02 NOTE — Patient Instructions (Signed)
[name] , ?Thank you for taking time to come for your Medicare Wellness Visit. I appreciate your ongoing commitment to your health goals. Please review the following plan we discussed and let me know if I can assist you in the future.  ? ?Screening recommendations/referrals: ?Colonoscopy: Not a candidate for screening due to age ?Recommended yearly ophthalmology/optometry visit for glaucoma screening and checkup ?Recommended yearly dental visit for hygiene and checkup ? ?Vaccinations: ?Influenza vaccine: Due Fall Season 2023 ?Pneumococcal vaccine: 05/09/2020; need Prevnar20 ?Tdap vaccine: 11/17/2017; due every 10 years ?Shingles vaccine: never done   ?Covid-19: 05/04/2020, 06/04/2020, 03/05/2021 ? ?Advanced directives: Advance directive discussed with you today. Even though you declined this today please call our office should you change your mind and we can give you the proper paperwork for you to fill out. ? ?Conditions/risks identified: Yes ? ?Next appointment: Please schedule your next Medicare Wellness Visit with your Nurse Health Advisor in 1 year or 366 days by calling [phone]. ? ?Preventive Care 79 Years and Older, Male ?Preventive care refers to lifestyle choices and visits with your health care provider that can promote health and wellness. ?What does preventive care include? ?A yearly physical exam. This is also called an annual well check. ?Dental exams once or twice a year. ?Routine eye exams. Ask your health care provider how often you should have your eyes checked. ?Personal lifestyle choices, including: ?Daily care of your teeth and gums. ?Regular physical activity. ?Eating a healthy diet. ?Avoiding tobacco and drug use. ?Limiting alcohol use. ?Practicing safe sex. ?Taking low doses of aspirin every day. ?Taking vitamin and mineral supplements as recommended by your health care provider. ?What happens during an annual well check? ?The services and screenings done by your health care provider during  your annual well check will depend on your age, overall health, lifestyle risk factors, and family history of disease. ?Counseling  ?Your health care provider may ask you questions about your: ?Alcohol use. ?Tobacco use. ?Drug use. ?Emotional well-being. ?Home and relationship well-being. ?Sexual activity. ?Eating habits. ?History of falls. ?Memory and ability to understand (cognition). ?Work and work Astronomer. ?Screening  ?You may have the following tests or measurements: ?Height, weight, and BMI. ?Blood pressure. ?Lipid and cholesterol levels. These may be checked every 5 years, or more frequently if you are over 67 years old. ?Skin check. ?Lung cancer screening. You may have this screening every year starting at age 79 if you have a 30-pack-year history of smoking and currently smoke or have quit within the past 15 years. ?Fecal occult blood test (FOBT) of the stool. You may have this test every year starting at age 79. ?Flexible sigmoidoscopy or colonoscopy. You may have a sigmoidoscopy every 5 years or a colonoscopy every 10 years starting at age 79. ?Prostate cancer screening. Recommendations will vary depending on your family history and other risks. ?Hepatitis C blood test. ?Hepatitis B blood test. ?Sexually transmitted disease (STD) testing. ?Diabetes screening. This is done by checking your blood sugar (glucose) after you have not eaten for a while (fasting). You may have this done every 1-3 years. ?Abdominal aortic aneurysm (AAA) screening. You may need this if you are a current or former smoker. ?Osteoporosis. You may be screened starting at age 79 if you are at high risk. ?Talk with your health care provider about your test results, treatment options, and if necessary, the need for more tests. ?Vaccines  ?Your health care provider may recommend certain vaccines, such as: ?Influenza vaccine. This is recommended every  year. ?Tetanus, diphtheria, and acellular pertussis (Tdap, Td) vaccine. You may need  a Td booster every 10 years. ?Zoster vaccine. You may need this after age 79. ?Pneumococcal 13-valent conjugate (PCV13) vaccine. One dose is recommended after age 79. ?Pneumococcal polysaccharide (PPSV23) vaccine. One dose is recommended after age 79. ?Talk to your health care provider about which screenings and vaccines you need and how often you need them. ?This information is not intended to replace advice given to you by your health care provider. Make sure you discuss any questions you have with your health care provider. ?Document Released: 12/13/2015 Document Revised: 08/05/2016 Document Reviewed: 09/17/2015 ?Elsevier Interactive Patient Education ? 2017 [place]. ? ?Fall Prevention in the Home ?Falls can cause injuries. They can happen to people of all ages. There are many things you can do to make your home safe and to help prevent falls. ?What can I do on the outside of my home? ?Regularly fix the edges of walkways and driveways and fix any cracks. ?Remove anything that might make you trip as you walk through a door, such as a raised step or threshold. ?Trim any bushes or trees on the path to your home. ?Use bright outdoor lighting. ?Clear any walking paths of anything that might make someone trip, such as rocks or tools. ?Regularly check to see if handrails are loose or broken. Make sure that both sides of any steps have handrails. ?Any raised decks and porches should have guardrails on the edges. ?Have any leaves, snow, or ice cleared regularly. ?Use sand or salt on walking paths during winter. ?Clean up any spills in your garage right away. This includes oil or grease spills. ?What can I do in the bathroom? ?Use night lights. ?Install grab bars by the toilet and in the tub and shower. Do not use towel bars as grab bars. ?Use non-skid mats or decals in the tub or shower. ?If you need to sit down in the shower, use a plastic, non-slip stool. ?Keep the floor dry. Clean up any water that spills on the  floor as soon as it happens. ?Remove soap buildup in the tub or shower regularly. ?Attach bath mats securely with double-sided non-slip rug tape. ?Do not have throw rugs and other things on the floor that can make you trip. ?What can I do in the bedroom? ?Use night lights. ?Make sure that you have a light by your bed that is easy to reach. ?Do not use any sheets or blankets that are too big for your bed. They should not hang down onto the floor. ?Have a firm chair that has side arms. You can use this for support while you get dressed. ?Do not have throw rugs and other things on the floor that can make you trip. ?What can I do in the kitchen? ?Clean up any spills right away. ?Avoid walking on wet floors. ?Keep items that you use a lot in easy-to-reach places. ?If you need to reach something above you, use a strong step stool that has a grab bar. ?Keep electrical cords out of the way. ?Do not use floor polish or wax that makes floors slippery. If you must use wax, use non-skid floor wax. ?Do not have throw rugs and other things on the floor that can make you trip. ?What can I do with my stairs? ?Do not leave any items on the stairs. ?Make sure that there are handrails on both sides of the stairs and use them. Fix handrails that are broken or  loose. Make sure that handrails are as long as the stairways. ?Check any carpeting to make sure that it is firmly attached to the stairs. Fix any carpet that is loose or worn. ?Avoid having throw rugs at the top or bottom of the stairs. If you do have throw rugs, attach them to the floor with carpet tape. ?Make sure that you have a light switch at the top of the stairs and the bottom of the stairs. If you do not have them, ask someone to add them for you. ?What else can I do to help prevent falls? ?Wear shoes that: ?Do not have high heels. ?Have rubber bottoms. ?Are comfortable and fit you well. ?Are closed at the toe. Do not wear sandals. ?If you use a stepladder: ?Make sure that  it is fully opened. Do not climb a closed stepladder. ?Make sure that both sides of the stepladder are locked into place. ?Ask someone to hold it for you, if possible. ?Clearly mark and make sure tha

## 2022-03-02 NOTE — Progress Notes (Signed)
?I connected with [name] today by telephone and verified that I am speaking with the correct person using two identifiers. ?Location patient: home ?Location provider: work ?Persons participating in the virtual visit: patient, provider. ?  ?I discussed the limitations, risks, security and privacy concerns of performing an evaluation and management service by telephone and the availability of in person appointments. I also discussed with the patient that there may be a patient responsible charge related to this service. The patient expressed understanding and verbally consented to this telephonic visit.  ?  ?Interactive audio and video telecommunications were attempted between this provider and patient, however failed, due to patient having technical difficulties OR patient did not have access to video capability.  We continued and completed visit with audio only. ? ?Some vital signs may be absent or patient reported.  ? ?Time Spent with patient on telephone encounter: 30 minutes ? ?Subjective:  ? Jesus Arnold is a 81 y.o. male who presents for Medicare Annual/Subsequent preventive examination. ? ?Review of Systems    ? ?Cardiac Risk Factors include: advanced age (>16mn, >>55women);diabetes mellitus;dyslipidemia;male gender ? ?   ?Objective:  ?  ?There were no vitals filed for this visit. ?There is no height or weight on file to calculate BMI. ? ? ?  03/02/2022  ?  1:34 PM 09/12/2019  ?  1:08 PM 06/30/2018  ?  6:03 PM 12/31/2016  ? 12:46 PM  ?Advanced Directives  ?Does Patient Have a Medical Advance Directive? No No No No  ?Would patient like information on creating a medical advance directive? No - Patient declined No - Patient declined Yes (ED - Information included in AVS) No - Patient declined  ? ? ?Current Medications (verified) ?Outpatient Encounter Medications as of 03/02/2022  ?Medication Sig  ? atorvastatin (LIPITOR) 80 MG tablet Take 1 tablet (80 mg total) by mouth daily.  ? blood glucose meter kit and  supplies KIT Dispense based on patient and insurance preference. Use up to four times daily as directed. (E11.9).  ? glucose blood (ONE TOUCH ULTRA TEST) test strip Use one strip per test. Test blood sugars 1-4 times daily as instructed.  ? Lancets Misc. (ONE TOUCH SURESOFT) MISC Use 1 lancet per test. Test blood sugars 1-4 times per day as instructed.  ? metFORMIN (GLUCOPHAGE-XR) 500 MG 24 hr tablet TAKE 3 TABLETS BY MOUTH EVERY DAY WITH BREAKFAST  ? mometasone (ELOCON) 0.1 % cream Apply topically 2 (two) times daily.  ? Multiple Vitamin (MULTIVITAMIN) tablet Take 1 tablet by mouth daily.  ? pantoprazole (PROTONIX) 40 MG tablet Take 1 tablet (40 mg total) by mouth daily. (Patient not taking: Reported on 02/03/2022)  ? rosuvastatin (CRESTOR) 40 MG tablet TAKE 1 TABLET BY MOUTH EVERY DAY  ? ?No facility-administered encounter medications on file as of 03/02/2022.  ? ? ?Allergies (verified) ?Patient has no known allergies.  ? ?History: ?Past Medical History:  ?Diagnosis Date  ? Diabetes mellitus without complication ([place]   ? GERD (gastroesophageal reflux disease)   ? 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 ?History reviewed. No pertinent surgical history. ?History reviewed. No pertinent family history. ?Social History  ? ?Socioeconomic History  ? Marital status: Single  ?  Spouse name: Not on file  ? Number of children: 2  ? Years of education: 10  ? Highest education level: Not on file  ?Occupational History  ? Occupation: Retired  ?Tobacco Use  ? Smoking status: Former  ?  Types: Cigarettes  ?  Quit date: 01/28/2017  ?  Years since quitting: 5.0  ? Smokeless tobacco: Never  ?Vaping Use  ? Vaping Use: Never used  ?Substance and Sexual Activity  ? Alcohol use: No  ? Drug use: No  ? Sexual activity: Not Currently  ?Other Topics Concern  ? Not on file  ?Social History Narrative  ? Fun/Hobby: Relax; Watch tv, walking.   ? ?Social Determinants of Health  ? ?Financial Resource Strain: Low Risk   ? Difficulty of Paying  Living Expenses: Not hard at all  ?Food Insecurity: No Food Insecurity  ? Worried About Charity fundraiser in the Last Year: Never true  ? Ran Out of Food in the Last Year: Never true  ?Transportation Needs: No Transportation Needs  ? Lack of Transportation (Medical): No  ? Lack of Transportation (Non-Medical): No  ?Physical Activity: Sufficiently Active  ? Days of Exercise per Week: 5 days  ? Minutes of Exercise per Session: 30 min  ?Stress: No Stress Concern Present  ? Feeling of Stress : Not at all  ?Social Connections: Socially Isolated  ? Frequency of Communication with Friends and Family: More than three times a week  ? Frequency of Social Gatherings with Friends and Family: More than three times a week  ? Attends Religious Services: Never  ? Active Member of Clubs or Organizations: No  ? Attends Archivist Meetings: Never  ? Marital Status: Never married  ? ? ?Tobacco Counseling ?Counseling given: Not Answered ? ? ?Clinical Intake: ? ?Pre-visit preparation completed: Yes ? ?Pain : No/denies pain ? ?  ? ?Nutritional Risks: None ?Diabetes: Yes ?CBG done?: No ?Did pt. bring in CBG monitor from home?: No ? ?How often do you need to have someone help you when you read instructions, pamphlets, or other written materials from your doctor or pharmacy?: 1 - Never ?What is the last grade level you completed in school?: 10th grade ? ?Diabetic? yes ? ?Interpreter Needed?: No ? ?Information entered by :: Lisette Abu, LPN ? ? ?Activities of Daily Living ? ?  03/02/2022  ?  1:45 PM  ?In your present state of health, do you have any difficulty performing the following activities:  ?Hearing? 0  ?Vision? 0  ?Difficulty concentrating or making decisions? 0  ?Walking or climbing stairs? 0  ?Dressing or bathing? 0  ?Doing errands, shopping? 0  ?Preparing Food and eating ? N  ?Using the Toilet? N  ?In the past six months, have you accidently leaked urine? N  ?Do you have problems with loss of bowel control? N   ?Managing your Medications? N  ?Managing your Finances? N  ?Housekeeping or managing your Housekeeping? N  ? ? ?Patient Care Team: ?Biagio Borg, MD as PCP - General (Internal Medicine) ? ?Indicate any recent Medical Services you may have received from other than [facility] providers in the past year (date may be approximate). ? ?   ?Assessment:  ? This is a routine wellness examination for Jesus Arnold. ? ?Hearing/Vision screen ?Hearing Screening - Comments:: Patient denied any hearing difficulty.   ?No hearing aids. ? ?Vision Screening - Comments:: Patient does wear corrective lenses/contacts.  ?Eye exam done by: Luberta Mutter, MD. ? ? ?Dietary issues and exercise activities discussed: ?Current Exercise Habits: Home exercise routine, Type of exercise: walking, Time (Minutes): 30, Frequency (Times/Week): 5, Weekly Exercise (Minutes/Week): 150, Intensity: Moderate, Exercise limited by: None identified ? ? Goals Addressed   ? ?  ?  ?  ?  ?  ?  This Visit's Progress  ?   Patient Stated (pt-stated)     ?   Patient declined health goal at this time. ?  ? ?  ?Depression Screen ? ?  03/02/2022  ?  1:37 PM 02/03/2022  ? 10:09 AM 08/05/2021  ?  2:48 PM 01/27/2021  ?  3:26 PM 01/27/2021  ?  2:55 PM 09/12/2019  ?  1:09 PM 05/23/2019  ? 10:52 AM  ?PHQ 2/9 Scores  ?PHQ - 2 Score 0 0 0 0 0 0 0  ?  ?Fall Risk ? ?  03/02/2022  ?  1:45 PM 02/03/2022  ? 10:09 AM 08/05/2021  ?  2:48 PM 01/27/2021  ?  3:26 PM 01/27/2021  ?  2:55 PM  ?Fall Risk   ?Falls in the past year? 0 0 0 0 0  ?Number falls in past yr: 0 0 0  0  ?Injury with Fall? 0 0 0  0  ?Risk for fall due to : No Fall Risks      ?Follow up Falls evaluation completed      ? ? ?FALL RISK PREVENTION PERTAINING TO THE HOME: ? ?Any stairs in or around the home? No  ?If so, are there any without handrails? No  ?Home free of loose throw rugs in walkways, pet beds, electrical cords, etc? Yes  ?Adequate lighting in your home to reduce risk of falls? Yes  ? ?ASSISTIVE DEVICES UTILIZED TO PREVENT FALLS: ? ?Life  alert? No  ?Use of a cane, walker or w/c? Yes  ?Grab bars in the bathroom? Yes  ?Shower chair or bench in shower? Yes  ?Elevated toilet seat or a handicapped toilet? No  ? ?TIMED UP AND GO: ? ?Was the test performed

## 2022-03-25 NOTE — Progress Notes (Signed)
?[facility] Note ? ?03/26/2022 ? ?  ? ?CHIEF COMPLAINT ?Patient presents for Retina Follow Up ? ? ?HISTORY OF PRESENT ILLNESS: ?Jesus Arnold is a 81 y.o. male who presents to the clinic today for:  ? ?HPI   ? ? 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 ?).  In right eye.  This started years ago.  Duration of 10 weeks.  Since onset it is stable.  I, the attending physician,  performed the HPI with the patient and updated documentation appropriately. ? ?  ?  ? ? Comments   ?Patient denies noticing any visual changes at this time. His blood sugar at last check this morning was 87. He is unsure about his A1C.  ? ?  ?  ?Last edited by [name], MD on 03/26/2022  4:46 PM.  ?  ? ? ?Referring physician: ?Biagio Borg, MD ?[address] ?[place],  [place] [address] ? ?HISTORICAL INFORMATION:  ? ?Selected notes from the [facility] ?Referred by Dr. [name] for concern of BRVO ?LEE:  ?Ocular Hx- former [facility] pt, last visit 2.20.2020 -- BRVO w/ CME, history of multiple IVA (14) from June 2018 to Feb 2020. ?PMH- ?  ? ?CURRENT MEDICATIONS: ?No current outpatient medications on file. (Ophthalmic Drugs)  ? ?No current facility-administered medications for this visit. (Ophthalmic Drugs)  ? ?Current Outpatient Medications (Other)  ?Medication Sig  ? atorvastatin (LIPITOR) 80 MG tablet Take 1 tablet (80 mg total) by mouth daily.  ? blood glucose meter kit and supplies KIT Dispense based on patient and insurance preference. Use up to four times daily as directed. (E11.9).  ? glucose blood (ONE TOUCH ULTRA TEST) test strip Use one strip per test. Test blood sugars 1-4 times daily as instructed.  ? Lancets Misc. (ONE TOUCH SURESOFT) MISC Use 1 lancet per test. Test blood sugars 1-4 times per day as instructed.  ? metFORMIN (GLUCOPHAGE-XR) 500 MG 24 hr tablet TAKE 3 TABLETS BY MOUTH EVERY DAY WITH BREAKFAST  ? mometasone (ELOCON) 0.1 % cream Apply topically 2  (two) times daily.  ? Multiple Vitamin (MULTIVITAMIN) tablet Take 1 tablet by mouth daily.  ? rosuvastatin (CRESTOR) 40 MG tablet TAKE 1 TABLET BY MOUTH EVERY DAY  ? pantoprazole (PROTONIX) 40 MG tablet Take 1 tablet (40 mg total) by mouth daily. (Patient not taking: Reported on 02/03/2022)  ? ?No current facility-administered medications for this visit. (Other)  ? ?REVIEW OF SYSTEMS: ?ROS   ?Positive for: Endocrine, Eyes ?Negative for: Constitutional, Gastrointestinal, Neurological, Skin, Genitourinary, Musculoskeletal, HENT, Cardiovascular, Respiratory, Psychiatric, Allergic/Imm, Heme/Lymph ?Last edited by Annie Paras, COT on 03/26/2022  2:37 PM.  ?  ? ?ALLERGIES ?No Known Allergies ? ?PAST MEDICAL HISTORY ?Past Medical History:  ?Diagnosis Date  ? Diabetes mellitus without complication ([place])   ? GERD (gastroesophageal reflux disease)   ? 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 ?History reviewed. No pertinent surgical history. ? ?FAMILY HISTORY ?History reviewed. No pertinent family history. ? ?SOCIAL HISTORY ?Social History  ? ?Tobacco Use  ? Smoking status: Former  ?  Types: Cigarettes  ?  Quit date: 01/28/2017  ?  Years since quitting: 5.1  ? Smokeless tobacco: Never  ?Vaping Use  ? Vaping Use: Never used  ?Substance Use Topics  ? Alcohol use: No  ? Drug use: No  ?  ? ?  ?OPHTHALMIC EXAM: ? ?Base Eye Exam   ? ?  Visual Acuity (Snellen - Linear)   ? ?   Right Left  ? Dist cc 20/70 20/40  ? Dist ph cc NI 20/30  ? ? Correction: Glasses  ? ?  ?  ? ? Tonometry (Tonopen, 2:48 PM)   ? ?   Right Left  ? Pressure 10 12  ? ?  ?  ? ? Pupils   ? ?   Pupils Dark Light Shape React APD  ? Right PERRL 2 1 Round Brisk None  ? Left PERRL 2 1 Round Brisk None  ? ?  ?  ? ? Visual Fields   ? ?   Left Right  ?  Full Full  ? ?  ?  ? ? Extraocular Movement   ? ?   Right Left  ?  Full, Ortho Full, Ortho  ? ?  ?  ? ? Neuro/Psych   ? ? Oriented x3: Yes  ? Mood/Affect: Normal  ? ?  ?  ? ? Dilation   ? ? Both eyes: 1.0% Mydriacyl,  2.5% Phenylephrine @ 2:40 PM  ? ?  ?  ? ?  ? ?Slit Lamp and Fundus Exam   ? ? Slit Lamp Exam   ? ?   Right Left  ? Lids/Lashes Dermatochalasis - upper lid, Meibomian gland dysfunction Dermatochalasis - upper lid, Meibomian gland dysfunction  ? Conjunctiva/Sclera Melanosis Melanosis  ? Cornea arcus, trace PEE arcus, trace PEE  ? Anterior Chamber Deep and quiet Deep and quiet  ? Iris Round and dilated Round and dilated  ? Lens 2-3+ Nuclear sclerosis, 2-3+ Cortical cataract 2-3+ Nuclear sclerosis, 2-3+ Cortical cataract  ? Anterior Vitreous Vitreous syneresis, silicone micro bubbles Vitreous syneresis, silicone micro bubbles, Posterior vitreous detachment, vitreous condensations  ? ?  ?  ? ? Fundus Exam   ? ?   Right Left  ? Disc Pink and Sharp Pink and Sharp, +cupping, mild PPA  ? C/D Ratio 0.6 0.7  ? Macula Flat, Blunted foveal reflex, scattered MA/DBH and exudates temporal macula, +edema IT macula - improved Flat, Good foveal reflex, scattered MA greatest perifovea, mild ERM, interval increase in cystic changes  ? Vessels attenuated, Copper wiring, Tortuous, early sclerosis distal IT arcades attenuated, Copper wiring, Tortuous, early sclerosis distal IT arcades  ? Periphery Attached, rare MA/DBH Attached, rare MA/DBH  ? ?  ?  ? ?  ? ?Refraction   ? ? Wearing Rx   ? ?   Sphere Cylinder Add  ? Right +1.75 Sphere +2.75  ? Left +1.75 Sphere +2.75  ? ? Age: 60yr  ? ?  ?  ? ? Manifest Refraction   ? ?   Sphere Cylinder Axis Dist VA  ? Right +2.00 +0.50 180 20/70  ? Left +1.75 +0.25 160 20/30  ? ?  ?  ? ?  ? ? ?IMAGING AND PROCEDURES  ?Imaging and Procedures for 03/26/2022 ? ?OCT, Retina - OU - Both Eyes   ? ?   ?Right Eye ?Quality was good. Central Foveal Thickness: 321. Progression has improved. Findings include abnormal foveal contour, intraretinal fluid, no SRF, intraretinal hyper-reflective material, lamellar hole (Interval improvement in IRF/edema IT macula and fovea, +lamellar macular hole).  ? ?Left Eye ?Quality was  good. Central Foveal Thickness: 284. Progression has worsened. Findings include intraretinal fluid, no SRF, abnormal foveal contour (Interval increase in IRF IT foveal and macula).  ? ?Notes ?*Images captured and stored on drive ? ?Diagnosis / Impression:  ?OD: BRVO w/ CME - Interval  improvement in IRF/edema IT macula and fovea, +lamellar macular hole ?OS: mild DME -- Interval increase in IRF IT foveal and macula ? ?Clinical management:  ?See below ? ?Abbreviations: NFP - Normal foveal profile. CME - cystoid macular edema. PED - pigment epithelial detachment. IRF - intraretinal fluid. SRF - subretinal fluid. EZ - ellipsoid zone. ERM - epiretinal membrane. ORA - outer retinal atrophy. ORT - outer retinal tubulation. SRHM - subretinal hyper-reflective material. IRHM - intraretinal hyper-reflective material ? ? ?  ? ?Fluorescein Angiography Optos (Transit OD)   ? ?   ?Right Eye ?Early phase findings include delayed filling, microaneurysm, vascular perfusion defect. Mid/Late phase findings include microaneurysm, vascular perfusion defect, leakage (Vascular perfusion defects central and temporal macula, cluster of MA and telangectasias w/ late leakage inferior macula, no NV).  ? ?Left Eye ?Early phase findings include microaneurysm. Mid/Late phase findings include microaneurysm, leakage (Mild cluster of MA's inferior to fovea with late leakage, no NV).  ? ?Notes ?**Images stored on drive** ? ?Impression: ?BRVO OD -- inf macula w/ vascular perfusion defects, telangectasias and mild perivascular leakage ?Mild NPDR OU ?Late leaking MA's OU ?No NV OU ? ? ?  ? ?Intravitreal Injection, Pharmacologic Agent - OD - Right Eye   ? ?   ?Time Out ?03/26/2022. 4:06 PM. Confirmed correct patient, procedure, site, and patient consented.  ? ?Anesthesia ?Topical anesthesia was used. Anesthetic medications included Lidocaine 2%, Proparacaine 0.5%.  ? ?Procedure ?Preparation included 5% betadine to ocular surface, eyelid speculum. A supplied  needle was used.  ? ?Injection: ?1.25 mg Bevacizumab 1.22m/0.05ml ?  Route: Intravitreal, Site: Right Eye ?  NDC: 581829-937-16 Lot: [id number] , Expiration date: 05/13/2022  ? ?Post-op ?Post injection

## 2022-03-26 ENCOUNTER — Encounter (INDEPENDENT_AMBULATORY_CARE_PROVIDER_SITE_OTHER): Payer: Self-pay | Admitting: Ophthalmology

## 2022-03-26 ENCOUNTER — Ambulatory Visit (INDEPENDENT_AMBULATORY_CARE_PROVIDER_SITE_OTHER): Payer: Medicare HMO | Admitting: Ophthalmology

## 2022-03-26 DIAGNOSIS — E113313 Type 2 diabetes mellitus with moderate nonproliferative diabetic retinopathy with macular edema, bilateral: Secondary | ICD-10-CM

## 2022-03-26 DIAGNOSIS — I10 Essential (primary) hypertension: Secondary | ICD-10-CM

## 2022-03-26 DIAGNOSIS — E113213 Type 2 diabetes mellitus with mild nonproliferative diabetic retinopathy with macular edema, bilateral: Secondary | ICD-10-CM | POA: Diagnosis not present

## 2022-03-26 DIAGNOSIS — H35033 Hypertensive retinopathy, bilateral: Secondary | ICD-10-CM | POA: Diagnosis not present

## 2022-03-26 DIAGNOSIS — H34831 Tributary (branch) retinal vein occlusion, right eye, with macular edema: Secondary | ICD-10-CM

## 2022-03-26 DIAGNOSIS — H25813 Combined forms of age-related cataract, bilateral: Secondary | ICD-10-CM

## 2022-03-26 MED ORDER — BEVACIZUMAB CHEMO INJECTION 1.25MG/0.05ML SYRINGE FOR KALEIDOSCOPE
1.2500 mg | INTRAVITREAL | Status: AC | PRN
  Administered 2022-03-26: 1.25 mg via INTRAVITREAL

## 2022-04-22 NOTE — Progress Notes (Shared)
[facility] Note  04/24/2022     CHIEF COMPLAINT Patient presents for No chief complaint on file.   HISTORY OF PRESENT ILLNESS: Jesus Arnold is a 81 y.o. male who presents to the clinic today for:      Referring physician: Biagio Borg, MD [place],  [place] [address]  HISTORICAL INFORMATION:   Selected notes from the MEDICAL RECORD NUMBER Referred by Dr. [name] for concern of BRVO LEE:  Ocular Hx- former [facility] pt, last visit 2.20.2020 -- BRVO w/ CME, history of multiple IVA (14) from June 2018 to Feb 2020. PMH-    CURRENT MEDICATIONS: No current outpatient medications on file. (Ophthalmic Drugs)   No current facility-administered medications for this visit. (Ophthalmic Drugs)   Current Outpatient Medications (Other)  Medication Sig   atorvastatin (LIPITOR) 80 MG tablet Take 1 tablet (80 mg total) by mouth daily.   blood glucose meter kit and supplies KIT Dispense based on patient and insurance preference. Use up to four times daily as directed. (E11.9).   glucose blood (ONE TOUCH ULTRA TEST) test strip Use one strip per test. Test blood sugars 1-4 times daily as instructed.   Lancets Misc. (ONE TOUCH SURESOFT) MISC Use 1 lancet per test. Test blood sugars 1-4 times per day as instructed.   metFORMIN (GLUCOPHAGE-XR) 500 MG 24 hr tablet TAKE 3 TABLETS BY MOUTH EVERY DAY WITH BREAKFAST   mometasone (ELOCON) 0.1 % cream Apply topically 2 (two) times daily.   Multiple Vitamin (MULTIVITAMIN) tablet Take 1 tablet by mouth daily.   pantoprazole (PROTONIX) 40 MG tablet Take 1 tablet (40 mg total) by mouth daily. (Patient not taking: Reported on 02/03/2022)   rosuvastatin (CRESTOR) 40 MG tablet TAKE 1 TABLET BY MOUTH EVERY DAY   No current facility-administered medications for this visit. 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  FAMILY HISTORY No family history on file.  SOCIAL HISTORY Social History   Tobacco Use   Smoking status: Former    Types: Cigarettes    Quit date: 01/28/2017    Years since quitting: 5.2   Smokeless tobacco: Never  Vaping Use   Vaping Use: Never used  Substance Use Topics   Alcohol use: No   Drug use: No       OPHTHALMIC EXAM:  Not recorded     IMAGING AND PROCEDURES  Imaging and Procedures for 04/24/2022           ASSESSMENT/PLAN:    ICD-10-CM   1. Branch retinal vein occlusion of right eye with macular edema  H34.8310     2. 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 Essential hypertension  I10     4. Hypertensive retinopathy of both eyes  H35.033     5. Combined forms of age-related cataract of both eyes  H25.813       1. BRVO w/ CME OD - delayed f/u -- 10 wks instead of 4 -- pt forgot appt  - s/p IVA OD #15 (02.15.23), #16 (04.27.23 - former pt of Dr. [name] -- history of IVA OD  x14 from June 2018 to Feb 2020 -- then lost to retina f/u - FA 4.27.23 shows inf macula w/ vascular perfusion defects, telangectasias and mild perivascular leakage - BCVA decreased to 20/70 from 20/40 OD - OCT shows interval improvement in IRF/edema IT macula and fovea, +lamellar macular hole - recommend IVA OD #17 today, 05.26.23 - pt wishes to proceed with injection - RBA of procedure discussed, questions answered - informed consent obtained and signed for IVA OD on 02.15.23 - see procedure note - F/U 4 weeks -- DFE/OCT/possible injection  2. Mild Non-proliferative diabetic retinopathy, both eyes  - A1c: 6.3 on 08/05/21  - s/p IVA OS #1 (04.27.23) - exam show scattered MA/DBH OU - OCT shows  OD: BRVO w/ CME -  Interval improvement in IRF/edema IT macula and fovea, +lamellar macular hole; OS: mild DME -- Interval increase in IRF IT foveal and macula - FA (04.27.23) shows late leaking MA's OU, no NV OU - recommend IVA OS #2 today, 05.26.23 for worsening DME and decreased VA - pt wishes to proceed with injection - RBA of procedure discussed, questions answered - informed consent obtained and signed for IVA OS 4.27.23 - see procedure note - f/u in 4 wks -- DFE/OCT, possible injection  3,4. Hypertensive retinopathy OU - discussed importance of tight BP control - monitor   5. Mixed Cataract OU - The symptoms of cataract, surgical options, and treatments and risks were discussed with patient. - discussed diagnosis and progression - monitor   Ophthalmic Meds Ordered this visit:  No orders of the defined types were placed in this encounter.    No follow-ups on file.  There are no Patient Instructions on file for this visit.   Explained the diagnoses, plan, and follow up with the patient and they expressed understanding.  Patient expressed understanding of the importance of proper follow up care.   This document serves as a record of services personally performed by Gardiner Sleeper, MD, PhD. It was created on their behalf by Leonie Douglas, an ophthalmic technician. The creation of this record is the provider's dictation and/or activities during the visit.    Electronically signed by: Leonie Douglas COA, 04/22/22  10:51 AM   Gardiner Sleeper, M.D., Ph.D. Diseases & Surgery of the Retina and Vitreous [facility]: M myopia (nearsighted); A astigmatism; H hyperopia (farsighted); P presbyopia; Mrx spectacle prescription;  CTL contact lenses; OD right eye; OS left eye; OU both eyes  XT exotropia; ET esotropia; PEK punctate epithelial keratitis; PEE punctate epithelial erosions; DES dry eye syndrome; MGD meibomian gland dysfunction; ATs artificial tears; PFAT's  preservative free artificial tears; [place] nuclear sclerotic cataract; PSC posterior subcapsular cataract; ERM epi-retinal membrane; PVD posterior vitreous detachment; RD retinal detachment; DM diabetes mellitus; DR diabetic retinopathy; NPDR non-proliferative diabetic retinopathy; PDR proliferative diabetic retinopathy; CSME clinically significant macular edema; DME diabetic macular edema; dbh dot blot hemorrhages; CWS cotton wool spot; POAG primary open angle glaucoma; C/D cup-to-disc ratio; HVF humphrey visual field; GVF goldmann visual field; OCT optical coherence tomography; IOP intraocular pressure; BRVO Branch retinal vein occlusion; CRVO central retinal vein occlusion; CRAO central retinal artery occlusion; BRAO branch retinal artery occlusion; RT retinal tear; SB scleral buckle; PPV pars plana vitrectomy; VH Vitreous hemorrhage; PRP panretinal laser photocoagulation; IVK intravitreal kenalog; VMT vitreomacular traction; MH Macular hole;  NVD neovascularization of the disc; NVE neovascularization elsewhere; AREDS age related eye disease study; ARMD age related macular degeneration; POAG primary open angle  glaucoma; EBMD epithelial/anterior basement membrane dystrophy; ACIOL anterior chamber intraocular lens; IOL intraocular lens; PCIOL posterior chamber intraocular lens; Phaco/IOL phacoemulsification with intraocular lens placement; [place] photorefractive keratectomy; LASIK laser assisted in situ keratomileusis; HTN hypertension; DM diabetes mellitus; COPD chronic obstructive pulmonary disease

## 2022-04-24 ENCOUNTER — Encounter (INDEPENDENT_AMBULATORY_CARE_PROVIDER_SITE_OTHER): Payer: Self-pay | Admitting: Ophthalmology

## 2022-04-24 ENCOUNTER — Encounter (INDEPENDENT_AMBULATORY_CARE_PROVIDER_SITE_OTHER): Payer: Medicare HMO | Admitting: Ophthalmology

## 2022-04-24 ENCOUNTER — Ambulatory Visit (INDEPENDENT_AMBULATORY_CARE_PROVIDER_SITE_OTHER): Payer: Medicare HMO | Admitting: Ophthalmology

## 2022-04-24 DIAGNOSIS — E113213 Type 2 diabetes mellitus with mild nonproliferative diabetic retinopathy with macular edema, bilateral: Secondary | ICD-10-CM

## 2022-04-24 DIAGNOSIS — H34831 Tributary (branch) retinal vein occlusion, right eye, with macular edema: Secondary | ICD-10-CM

## 2022-04-24 DIAGNOSIS — H35033 Hypertensive retinopathy, bilateral: Secondary | ICD-10-CM

## 2022-04-24 DIAGNOSIS — I10 Essential (primary) hypertension: Secondary | ICD-10-CM

## 2022-04-24 DIAGNOSIS — H25813 Combined forms of age-related cataract, bilateral: Secondary | ICD-10-CM | POA: Diagnosis not present

## 2022-04-24 DIAGNOSIS — E113313 Type 2 diabetes mellitus with moderate nonproliferative diabetic retinopathy with macular edema, bilateral: Secondary | ICD-10-CM

## 2022-04-24 NOTE — Progress Notes (Incomplete)
[facility] Note  04/24/2022     CHIEF COMPLAINT Patient presents for Retina Follow Up   HISTORY OF PRESENT ILLNESS: Jesus Arnold is a 81 y.o. male who presents to the clinic today for:   HPI     Retina Follow Up   Patient presents with  Diabetic Retinopathy.  In both eyes.  This started 4 weeks ago.  I, the attending physician,  performed the HPI with the patient and updated documentation appropriately.        Comments   Patient here for 4 weeks retina follow up for NPDR OU. Patient states vision doing fine. No eye pain.       Last edited by [name], MD on 04/28/2022 12:31 AM.     Referring physician: Biagio Borg, MD [place],   [id number]  HISTORICAL INFORMATION:   Selected notes from the MEDICAL RECORD NUMBER Referred by Dr. [name] for concern of BRVO LEE:  Ocular Hx- former [facility] pt, last visit 2.20.2020 -- BRVO w/ CME, history of multiple IVA (14) from June 2018 to Feb 2020. PMH-    CURRENT MEDICATIONS: No current outpatient medications on file. (Ophthalmic Drugs)   No current facility-administered medications for this visit. (Ophthalmic Drugs)   Current Outpatient Medications (Other)  Medication Sig   atorvastatin (LIPITOR) 80 MG tablet Take 1 tablet (80 mg total) by mouth daily.   blood glucose meter kit and supplies KIT Dispense based on patient and insurance preference. Use up to four times daily as directed. (E11.9).   glucose blood (ONE TOUCH ULTRA TEST) test strip Use one strip per test. Test blood sugars 1-4 times daily as instructed.   Lancets Misc. (ONE TOUCH SURESOFT) MISC Use 1 lancet per test. Test blood sugars 1-4 times per day as instructed.   metFORMIN (GLUCOPHAGE-XR) 500 MG 24 hr tablet TAKE 3 TABLETS BY MOUTH EVERY DAY WITH BREAKFAST   mometasone (ELOCON) 0.1 % cream Apply topically 2 (two) times daily.   Multiple Vitamin (MULTIVITAMIN) tablet Take 1 tablet by mouth daily.    rosuvastatin (CRESTOR) 40 MG tablet TAKE 1 TABLET BY MOUTH EVERY DAY   pantoprazole (PROTONIX) 40 MG tablet Take 1 tablet (40 mg total) by mouth daily. (Patient not taking: Reported on 02/03/2022)   No current facility-administered medications for this visit. (Other)   REVIEW OF SYSTEMS: ROS   Positive for: Endocrine, Eyes Negative for: Constitutional, Gastrointestinal, Neurological, Skin, Genitourinary, Musculoskeletal, HENT, Cardiovascular, Respiratory, Psychiatric, Allergic/Imm, Heme/Lymph Last edited by Theodore Demark, COA on 04/24/2022  2:47 PM.     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 No pertinent surgical history.  FAMILY HISTORY History reviewed. No pertinent family history.  SOCIAL HISTORY Social History   Tobacco Use   Smoking status: Former    Types: Cigarettes    Quit date: 01/28/2017    Years since quitting: 5.2   Smokeless tobacco: Never  Vaping Use   Vaping Use: Never used  Substance Use Topics   Alcohol use: No   Drug use: No       OPHTHALMIC EXAM:  Base Eye Exam     Visual Acuity (Snellen - Linear)       Right Left   Dist cc 20/70 -2 20/30 -2  Dist ph cc 20/60 -2 20/20 -2    Correction: Glasses         Tonometry (Tonopen, 2:45 PM)       Right Left   Pressure 13 17         Pupils       Dark Light Shape React APD   Right 2 1 Round Brisk None   Left 2 1 Round Brisk None         Visual Fields (Counting fingers)       Left Right    Full Full         Extraocular Movement       Right Left    Full, Ortho Full, Ortho         Neuro/Psych     Oriented x3: Yes   Mood/Affect: Normal         Dilation     Both eyes: 1.0% Mydriacyl, 2.5% Phenylephrine @ 2:45 PM           Slit Lamp and Fundus Exam     Slit  Lamp Exam       Right Left   Lids/Lashes Dermatochalasis - upper lid, Meibomian gland dysfunction Dermatochalasis - upper lid, Meibomian gland dysfunction   Conjunctiva/Sclera Melanosis Melanosis   Cornea arcus, trace PEE arcus, trace PEE   Anterior Chamber Deep and quiet Deep and quiet   Iris Round and dilated Round and dilated   Lens 2-3+ Nuclear sclerosis, 2-3+ Cortical cataract 2-3+ Nuclear sclerosis, 2-3+ Cortical cataract   Anterior Vitreous Vitreous syneresis, silicone micro bubbles Vitreous syneresis, silicone micro bubbles, Posterior vitreous detachment, vitreous condensations         Fundus Exam       Right Left   Disc Pink and Sharp Pink and Sharp, +cupping, mild PPA   C/D Ratio 0.6 0.7   Macula Flat, Blunted foveal reflex, scattered MA/DBH and exudates temporal macula, +edema IT macula - improved Flat, Good foveal reflex, scattered MA greatest perifovea, mild ERM, mild interval improvement in cystic changes   Vessels attenuated, Copper wiring, Tortuous, early sclerosis distal IT arcades attenuated, Copper wiring, Tortuous, early sclerosis distal IT arcades   Periphery Attached, rare MA/DBH Attached, rare MA/DBH           Refraction     Wearing Rx       Sphere Cylinder Add   Right +1.75 Sphere +2.75   Left +1.75 Sphere +2.75            IMAGING AND PROCEDURES  Imaging and Procedures for 04/24/2022  OCT, Retina - OU - Both Eyes       Right Eye Quality was good. Central Foveal Thickness: 329. Progression has improved. Findings include abnormal foveal contour, intraretinal fluid, no SRF, intraretinal hyper-reflective material, lamellar hole (Mild interval improvement in IRF temporal macula, persistent centrally, +lamellar macular hole).   Left Eye Quality was good. Central Foveal Thickness: 274. Progression has improved. Findings include intraretinal fluid, no SRF, abnormal foveal contour (Mild Interval improvement in IRF IT fovea and macula).    Notes *Images captured and stored on drive  Diagnosis / Impression:  OD: BRVO w/ CME - Mild interval improvement in IRF temporal macula, persistent centrally, +lamellar macular hole OS: mild DME --Mild Interval improvement in IRF IT fovea and macula  Clinical management:  See below  Abbreviations: NFP - Normal foveal profile. CME - cystoid macular edema. PED - pigment epithelial detachment. IRF - intraretinal fluid. SRF - subretinal fluid. EZ - ellipsoid  zone. ERM - epiretinal membrane. ORA - outer retinal atrophy. ORT - outer retinal tubulation. SRHM - subretinal hyper-reflective material. IRHM - intraretinal hyper-reflective material      Intravitreal Injection, Pharmacologic Agent - OD - Right Eye       Time Out 04/24/2022. 4:27 PM. Confirmed correct patient, procedure, site, and patient consented.   Anesthesia Topical anesthesia was used. Anesthetic medications included Lidocaine 2%, Proparacaine 0.5%.   Procedure Preparation included 5% betadine to ocular surface, eyelid speculum. A supplied needle was used.   Injection: 1.25 mg Bevacizumab 1.26m/0.05ml   Route: Intravitreal, Site: Right Eye   NDC:: 82505-397-67 Lot: [id number] , Expiration date: 06/16/2022   Post-op Post injection exam found visual acuity of at least counting fingers. The patient tolerated the procedure well. There were no complications. The patient received written and verbal post procedure care education. Post injection medications were not given.      Intravitreal Injection, Pharmacologic Agent - OS - Left Eye       Time Out 04/24/2022. 4:28 PM. Confirmed correct patient, procedure, site, and patient consented.   Anesthesia Topical anesthesia was used. Anesthetic medications included Lidocaine 2%, Proparacaine 0.5%.   Procedure Preparation included 5% betadine to ocular surface, eyelid speculum. A (32g) needle was used.   Injection: 1.25 mg Bevacizumab 1.260m0.05ml   Route: Intravitreal,  Site: Left Eye   NDC: 30mLot: : [id number] date: 06/14/2022   Post-op Post injection exam found visual acuity of at least counting fingers. The patient tolerated the procedure well. There were no complications. The patient received written and verbal post procedure care education. Post injection medications were not given.            ASSESSMENT/PLAN:    ICD-10-CM   1. Branch retinal vein occlusion of right eye with macular edema  H34.8310 OCT, Retina - OU - Both Eyes    Intravitreal Injection, Pharmacologic Agent - OD - Right Eye    Bevacizumab (AVASTIN) SOLN 1.25 mg    2. 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 Essential hypertension  I10     4. Hypertensive retinopathy of both eyes  H35.033     5. Combined forms of age-related cataract of both eyes  H25.813      1. BRVO w/ CME OD  - s/p IVA OD #15 (02.15.23), #16 (04.27.23) - former pt of Dr. [id number]- history of IVA OD x14 from June 2018 to Feb 2020 -- then lost to retina f/u - FA 4.27.23 shows inf macula w/ vascular perfusion defects, telangectasias and mild perivascular leakage - BCVA improved to 20/60 from 20/70 OD - OCT shows OD: BRVO w/ CME - Mild interval improvement in IRF temporal macula, persistent centrally, +lamellar macular hole - recommend IVA OD #17 today, 05.26.23 - pt wishes to proceed with injection - RBA of procedure discussed, questions answered - informed consent obtained and signed for IVA OD on 02.15.23 - see procedure note - F/U 4 weeks -- DFE/OCT/possible injection  2. Mild Non-proliferative diabetic retinopathy, both eyes  - s/p IVA OS #1 (04.27.23)  - A1c: 6.3 on 08/05/21 -  exam show scattered MA/DBH OU - OCT shows OS: mild DME --Mild Interval improvement  in IRF IT fovea and macula - FA (04.27.23) shows late leaking MA's OU, no NV OU - recommend IVA OS #2 today, 05.26.23  - pt wishes to proceed with injection - RBA of procedure discussed, questions answered - informed consent obtained and signed for IVA OS 4.27.23 - see procedure note - f/u in 4 wks -- DFE/OCT, possible injection  3,4. Hypertensive retinopathy OU - discussed importance of tight BP control - monitor  5. Mixed Cataract OU - The symptoms of cataract, surgical options, and treatments and risks were discussed with patient. - discussed diagnosis and progression - monitor  Ophthalmic Meds Ordered this visit:  Meds ordered this encounter  Medications   Bevacizumab (AVASTIN) SOLN 1.25 mg   Bevacizumab (AVASTIN) SOLN 1.25 mg     Return in about 4 weeks (around 05/22/2022) for f/u BRVO OD / NPDR OU, DFE, OCT.  There are no Patient Instructions on file for this visit.   Explained the diagnoses, plan, and follow up with the patient and they expressed understanding.  Patient expressed understanding of the importance of proper follow up care.   This document serves as a record of services personally performed by Gardiner Sleeper, MD, PhD. It was created on their behalf by San Jetty. Owens Shark, OA an ophthalmic technician. The creation of this record is the provider's dictation and/or activities during the visit.    Electronically signed by: San Jetty. Owens Shark, [place] 05.26.2023 12:46 AM   Gardiner Sleeper, M.D., Ph.D. Diseases & Surgery of the Retina and Vitreous [facility]  I have reviewed the above documentation for accuracy and completeness, and I agree with the above. Gardiner Sleeper, M.D., Ph.D. 04/28/22 12:46 AM   Abbreviations: M myopia (nearsighted); A astigmatism; H hyperopia (farsighted); P presbyopia; Mrx spectacle prescription;  CTL contact lenses; OD right eye; OS  left eye; OU both eyes  XT exotropia; ET esotropia; PEK punctate epithelial keratitis; PEE punctate epithelial erosions; DES dry eye syndrome; MGD meibomian gland dysfunction; ATs artificial tears; PFAT's preservative free artificial tears; [place] nuclear sclerotic cataract; PSC posterior subcapsular cataract; ERM epi-retinal membrane; PVD posterior vitreous detachment; RD retinal detachment; DM diabetes mellitus; DR diabetic retinopathy; NPDR non-proliferative diabetic retinopathy; PDR proliferative diabetic retinopathy; CSME clinically significant macular edema; DME diabetic macular edema; dbh dot blot hemorrhages; CWS cotton wool spot; POAG primary open angle glaucoma; C/D cup-to-disc ratio; HVF humphrey visual field; GVF goldmann visual field; OCT optical coherence tomography; IOP intraocular pressure; BRVO Branch retinal vein occlusion; CRVO central retinal vein occlusion; CRAO central retinal artery occlusion; BRAO branch retinal artery occlusion; RT retinal tear; SB scleral buckle; PPV pars plana vitrectomy; VH Vitreous hemorrhage; PRP panretinal laser photocoagulation; IVK intravitreal kenalog; VMT vitreomacular traction; MH Macular hole;  NVD neovascularization of the disc; NVE neovascularization elsewhere; AREDS age related eye disease study; ARMD age related macular degeneration; POAG primary open angle glaucoma; EBMD epithelial/anterior basement membrane dystrophy; ACIOL anterior chamber intraocular lens; IOL intraocular lens; PCIOL posterior chamber intraocular lens; Phaco/IOL phacoemulsification with intraocular lens placement; [place] photorefractive keratectomy; LASIK laser assisted in situ keratomileusis; HTN hypertension; DM diabetes mellitus; COPD chronic obstructive pulmonary disease

## 2022-04-28 ENCOUNTER — Encounter (INDEPENDENT_AMBULATORY_CARE_PROVIDER_SITE_OTHER): Payer: Self-pay | Admitting: Ophthalmology

## 2022-04-28 MED ORDER — BEVACIZUMAB CHEMO INJECTION 1.25MG/0.05ML SYRINGE FOR KALEIDOSCOPE
1.2500 mg | INTRAVITREAL | Status: AC | PRN
  Administered 2022-04-28: 1.25 mg via INTRAVITREAL

## 2022-05-19 NOTE — Progress Notes (Shared)
[facility] Note  05/22/2022     CHIEF COMPLAINT Patient presents for No chief complaint on file.   HISTORY OF PRESENT ILLNESS: Jesus Arnold is a 81 y.o. male who presents to the clinic today for:     Referring physician: Biagio Borg, MD [place],   [id number]  HISTORICAL INFORMATION:   Selected notes from the MEDICAL RECORD NUMBER Referred by Dr. [name] for concern of BRVO LEE:  Ocular Hx- former [facility] pt, last visit 2.20.2020 -- BRVO w/ CME, history of multiple IVA (14) from June 2018 to Feb 2020. PMH-    CURRENT MEDICATIONS: No current outpatient medications on file. (Ophthalmic Drugs)   No current facility-administered medications for this visit. (Ophthalmic Drugs)   Current Outpatient Medications (Other)  Medication Sig   atorvastatin (LIPITOR) 80 MG tablet Take 1 tablet (80 mg total) by mouth daily.   blood glucose meter kit and supplies KIT Dispense based on patient and insurance preference. Use up to four times daily as directed. (E11.9).   glucose blood (ONE TOUCH ULTRA TEST) test strip Use one strip per test. Test blood sugars 1-4 times daily as instructed.   Lancets Misc. (ONE TOUCH SURESOFT) MISC Use 1 lancet per test. Test blood sugars 1-4 times per day as instructed.   metFORMIN (GLUCOPHAGE-XR) 500 MG 24 hr tablet TAKE 3 TABLETS BY MOUTH EVERY DAY WITH BREAKFAST   mometasone (ELOCON) 0.1 % cream Apply topically 2 (two) times daily.   Multiple Vitamin (MULTIVITAMIN) tablet Take 1 tablet by mouth daily.   pantoprazole (PROTONIX) 40 MG tablet Take 1 tablet (40 mg total) by mouth daily. (Patient not taking: Reported on 02/03/2022)   rosuvastatin (CRESTOR) 40 MG tablet TAKE 1 TABLET BY MOUTH EVERY DAY   No current facility-administered medications for this visit. 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  FAMILY HISTORY No family history on file.  SOCIAL HISTORY Social History   Tobacco Use   Smoking status: Former    Types: Cigarettes    Quit date: 01/28/2017    Years since quitting: 5.3   Smokeless tobacco: Never  Vaping Use   Vaping Use: Never used  Substance Use Topics   Alcohol use: No   Drug use: No       OPHTHALMIC EXAM:  Not recorded     IMAGING AND PROCEDURES  Imaging and Procedures for 05/22/2022          ASSESSMENT/PLAN:    ICD-10-CM   1. Branch retinal vein occlusion of right eye with macular edema  H34.8310     2. 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 Essential hypertension  I10     4. Hypertensive retinopathy of both eyes  H35.033     5. Combined forms of age-related cataract of both eyes  H25.813       1. BRVO w/ CME OD  - s/p IVA OD #15 (02.15.23), #16 (04.27.23), #17 (05.26.23) - former pt of Dr. [name] -- history of IVA OD x14 from June 2018 to Feb 2020 -- then lost to retina f/u -  FA 4.27.23 shows inf macula w/ vascular perfusion defects, telangectasias and mild perivascular leakage - BCVA improved to 20/60 from 20/70 OD - OCT shows OD: BRVO w/ CME - Mild interval improvement in IRF temporal macula, persistent centrally, +lamellar macular hole - recommend IVA OD #18 today, 06.23.23 - pt wishes to proceed with injection - RBA of procedure discussed, questions answered - informed consent obtained and signed for IVA OD on 02.15.23 - see procedure note - F/U 4 weeks -- DFE/OCT/possible injection  2. Mild Non-proliferative diabetic retinopathy, both eyes  - s/p IVA OS #1 (04.27.23), #2 (05.26.23)  - A1c: 6.3 on 08/05/21 - exam show scattered MA/DBH OU - OCT shows OS: mild DME  --Mild Interval improvement in IRF IT fovea and macula - FA (04.27.23) shows late leaking MA's OU, no NV OU - recommend IVA OS #3 today, 06.23.23  - pt wishes to proceed with injection - RBA of procedure discussed, questions answered - informed consent obtained and signed for IVA OS 4.27.23 - see procedure note - f/u in 4 wks -- DFE/OCT, possible injection  3,4. Hypertensive retinopathy OU - discussed importance of tight BP control - monitor   5. Mixed Cataract OU - The symptoms of cataract, surgical options, and treatments and risks were discussed with patient. - discussed diagnosis and progression - monitor   Ophthalmic Meds Ordered this visit:  No orders of the defined types were placed in this encounter.    No follow-ups on file.  There are no Patient Instructions on file for this visit.   Explained the diagnoses, plan, and follow up with the patient and they expressed understanding.  Patient expressed understanding of the importance of proper follow up care.   This document serves as a record of services personally performed by Gardiner Sleeper, MD, PhD. It was created on their behalf by Leonie Douglas, an ophthalmic technician. The creation of this record is the provider's dictation and/or activities during the visit.    Electronically signed by: Leonie Douglas COA, 05/19/22  12:05 PM    Gardiner Sleeper, M.D., Ph.D. Diseases & Surgery of the Retina and Vitreous [facility]: M myopia (nearsighted); A astigmatism; H hyperopia (farsighted); P presbyopia; Mrx spectacle prescription;  CTL contact lenses; OD right eye; OS left eye; OU both eyes  XT exotropia; ET esotropia; PEK punctate epithelial keratitis; PEE punctate epithelial erosions; DES dry eye syndrome; MGD meibomian gland dysfunction; ATs artificial tears; PFAT's preservative free artificial tears; [place] nuclear sclerotic cataract; PSC posterior subcapsular cataract; ERM epi-retinal  membrane; PVD posterior vitreous detachment; RD retinal detachment; DM diabetes mellitus; DR diabetic retinopathy; NPDR non-proliferative diabetic retinopathy; PDR proliferative diabetic retinopathy; CSME clinically significant macular edema; DME diabetic macular edema; dbh dot blot hemorrhages; CWS cotton wool spot; POAG primary open angle glaucoma; C/D cup-to-disc ratio; HVF humphrey visual field; GVF goldmann visual field; OCT optical coherence tomography; IOP intraocular pressure; BRVO Branch retinal vein occlusion; CRVO central retinal vein occlusion; CRAO central retinal artery occlusion; BRAO branch retinal artery occlusion; RT retinal tear; SB scleral buckle; PPV pars plana vitrectomy; VH Vitreous hemorrhage; PRP panretinal laser photocoagulation; IVK intravitreal kenalog; VMT vitreomacular traction; MH Macular hole;  NVD neovascularization of the disc; NVE neovascularization elsewhere; AREDS age related eye disease study; ARMD age related macular degeneration; POAG primary open angle glaucoma; EBMD epithelial/anterior basement membrane dystrophy; ACIOL anterior chamber intraocular lens; IOL intraocular lens; PCIOL posterior chamber intraocular lens; Phaco/IOL phacoemulsification with intraocular lens placement; PRK photorefractive  keratectomy; LASIK laser assisted in situ keratomileusis; HTN hypertension; DM diabetes mellitus; COPD chronic obstructive pulmonary disease

## 2022-05-22 ENCOUNTER — Encounter (INDEPENDENT_AMBULATORY_CARE_PROVIDER_SITE_OTHER): Payer: Self-pay | Admitting: Ophthalmology

## 2022-05-22 ENCOUNTER — Ambulatory Visit (INDEPENDENT_AMBULATORY_CARE_PROVIDER_SITE_OTHER): Payer: Medicare HMO | Admitting: Ophthalmology

## 2022-05-22 DIAGNOSIS — H25813 Combined forms of age-related cataract, bilateral: Secondary | ICD-10-CM | POA: Diagnosis not present

## 2022-05-22 DIAGNOSIS — I10 Essential (primary) hypertension: Secondary | ICD-10-CM | POA: Diagnosis not present

## 2022-05-22 DIAGNOSIS — H35033 Hypertensive retinopathy, bilateral: Secondary | ICD-10-CM

## 2022-05-22 DIAGNOSIS — E113213 Type 2 diabetes mellitus with mild nonproliferative diabetic retinopathy with macular edema, bilateral: Secondary | ICD-10-CM

## 2022-05-22 DIAGNOSIS — H34831 Tributary (branch) retinal vein occlusion, right eye, with macular edema: Secondary | ICD-10-CM | POA: Diagnosis not present

## 2022-05-25 ENCOUNTER — Encounter (INDEPENDENT_AMBULATORY_CARE_PROVIDER_SITE_OTHER): Payer: Self-pay | Admitting: Ophthalmology

## 2022-05-25 MED ORDER — BEVACIZUMAB CHEMO INJECTION 1.25MG/0.05ML SYRINGE FOR KALEIDOSCOPE
1.2500 mg | INTRAVITREAL | Status: AC | PRN
  Administered 2022-05-25: 1.25 mg via INTRAVITREAL

## 2022-06-12 DIAGNOSIS — H34231 Retinal artery branch occlusion, right eye: Secondary | ICD-10-CM | POA: Diagnosis not present

## 2022-06-12 DIAGNOSIS — E113393 Type 2 diabetes mellitus with moderate nonproliferative diabetic retinopathy without macular edema, bilateral: Secondary | ICD-10-CM | POA: Diagnosis not present

## 2022-06-12 DIAGNOSIS — H35371 Puckering of macula, right eye: Secondary | ICD-10-CM | POA: Diagnosis not present

## 2022-06-15 NOTE — Progress Notes (Signed)
[facility] Note  06/19/2022     CHIEF COMPLAINT Patient presents for Retina Follow Up   HISTORY OF PRESENT ILLNESS: Jesus Arnold is a 81 y.o. male who presents to the clinic today for:   HPI     Retina Follow Up   Patient presents with  Wet AMD (IVA OD #18 06.23.23 IVA OS #3 06.23.23).  In both eyes.  This started weeks ago.  Duration of 4 weeks.  Since onset it is stable.  I, the attending physician,  performed the HPI with the patient and updated documentation appropriately.        Comments   Patient denies noticing any vision changes at this time. He feels that at times the vision is clearer when reading. His blood sugar was 83 and he is unsure of his A1C.       Last edited by [name], MD on 06/21/2022  4:35 AM.     Referring physician: Biagio Borg, MD [address] [place],  [place] [address]  HISTORICAL INFORMATION:   Selected notes from the MEDICAL RECORD NUMBER Referred by Dr. [name] for concern of BRVO LEE:  Ocular Hx- former [facility] pt, last visit 2.20.2020 -- BRVO w/ CME, history of multiple IVA (14) from June 2018 to Feb 2020. PMH-    CURRENT MEDICATIONS: No current outpatient medications on file. (Ophthalmic Drugs)   No current facility-administered medications for this visit. (Ophthalmic Drugs)   Current Outpatient Medications (Other)  Medication Sig   atorvastatin (LIPITOR) 80 MG tablet Take 1 tablet (80 mg total) by mouth daily.   blood glucose meter kit and supplies KIT Dispense based on patient and insurance preference. Use up to four times daily as directed. (E11.9).   glucose blood (ONE TOUCH ULTRA TEST) test strip Use one strip per test. Test blood sugars 1-4 times daily as instructed.   Lancets Misc. (ONE TOUCH SURESOFT) MISC Use 1 lancet per test. Test blood sugars 1-4 times per day as instructed.   metFORMIN (GLUCOPHAGE-XR) 500 MG 24 hr tablet TAKE 3 TABLETS BY MOUTH EVERY DAY WITH BREAKFAST   mometasone  (ELOCON) 0.1 % cream Apply topically 2 (two) times daily.   Multiple Vitamin (MULTIVITAMIN) tablet Take 1 tablet by mouth daily.   pantoprazole (PROTONIX) 40 MG tablet Take 1 tablet (40 mg total) by mouth daily. (Patient not taking: Reported on 02/03/2022)   rosuvastatin (CRESTOR) 40 MG tablet TAKE 1 TABLET BY MOUTH EVERY DAY   No current facility-administered medications for this visit. (Other)   REVIEW OF SYSTEMS: ROS   Positive for: Neurological, Endocrine, Eyes Negative for: Constitutional, Gastrointestinal, Skin, Genitourinary, Musculoskeletal, HENT, Cardiovascular, Respiratory, Psychiatric, Allergic/Imm, Heme/Lymph Last edited by Annie Paras, COT on 06/19/2022  1:56 PM.     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 No pertinent surgical history.  FAMILY HISTORY History reviewed. No pertinent family history.  SOCIAL HISTORY Social History   Tobacco Use   Smoking status: Former    Types: Cigarettes    Quit date: 01/28/2017    Years since quitting: 5.3   Smokeless tobacco: Never  Vaping Use   Vaping Use: Never used  Substance Use Topics   Alcohol use: No   Drug use: No  OPHTHALMIC EXAM:  Base Eye Exam     Visual Acuity (Snellen - Linear)       Right Left   Dist cc 20/60 +2 20/25 +1   Dist ph cc 20/40 +1     Correction: Glasses         Tonometry (Tonopen, 2:00 PM)       Right Left   Pressure 16 15         Pupils       Dark Light Shape React APD   Right 2 1 Round Brisk None   Left 2 1 Round Brisk None         Visual Fields       Left Right    Full Full         Extraocular Movement       Right Left    Full, Ortho Full, Ortho         Neuro/Psych     Oriented x3: Yes   Mood/Affect: Normal         Dilation      Both eyes: 1.0% Mydriacyl, 2.5% Phenylephrine @ 1:57 PM           Slit Lamp and Fundus Exam     Slit Lamp Exam       Right Left   Lids/Lashes Dermatochalasis - upper lid, Meibomian gland dysfunction Dermatochalasis - upper lid, Meibomian gland dysfunction   Conjunctiva/Sclera Melanosis Melanosis   Cornea arcus, trace PEE arcus, trace PEE   Anterior Chamber Deep and quiet Deep and quiet   Iris Round and dilated Round and dilated   Lens 2-3+ Nuclear sclerosis, 2-3+ Cortical cataract 2-3+ Nuclear sclerosis, 2-3+ Cortical cataract   Anterior Vitreous Vitreous syneresis, silicone micro bubbles, Posterior vitreous detachment, vitreous condensations Vitreous syneresis, silicone micro bubbles, Posterior vitreous detachment, vitreous condensations         Fundus Exam       Right Left   Disc Pink and Sharp Pink and Sharp, +cupping, mild PPA   C/D Ratio 0.6 0.7   Macula Flat, good foveal reflex, scattered MA/DBH and exudates temporal macula, +edema IT macula - slightly improved Flat, Good foveal reflex, scattered MA greatest perifovea - improved, mild ERM, mild interval improvement in cystic changes   Vessels attenuated, tortuous, early sclerosis distal IT arcades attenuated, Copper wiring, Tortuous, early sclerosis distal IT arcades   Periphery Attached, rare MA/DBH Attached, rare MA/DBH           Refraction     Wearing Rx       Sphere Cylinder Add   Right +1.75 Sphere +2.75   Left +1.75 Sphere +2.75           IMAGING AND PROCEDURES  Imaging and Procedures for 06/19/2022  OCT, Retina - OU - Both Eyes       Right Eye Quality was good. Central Foveal Thickness: 305. Progression has improved. Findings include no SRF, abnormal foveal contour, intraretinal hyper-reflective material, intraretinal fluid, lamellar hole (Mild interval improvement in cystic changes temporal macula, +lamellar macular hole).   Left Eye Quality was good. Central Foveal Thickness: 280. Progression  has improved. Findings include no SRF, abnormal foveal contour, intraretinal fluid (Mild Interval increase in IRF IT fovea and macula).   Notes *Images captured and stored on drive  Diagnosis / Impression:  OD: BRVO w/ CME - Mild interval improvement in cystic changes temporal macula, +lamellar macular hole OS: mild DME --Mild Interval improvement in IRF IT fovea and macula  Clinical management:  See below  Abbreviations: NFP - Normal foveal profile. CME - cystoid macular edema. PED - pigment epithelial detachment. IRF - intraretinal fluid. SRF - subretinal fluid. EZ - ellipsoid zone. ERM - epiretinal membrane. ORA - outer retinal atrophy. ORT - outer retinal tubulation. SRHM - subretinal hyper-reflective material. IRHM - intraretinal hyper-reflective material      Intravitreal Injection, Pharmacologic Agent - OD - Right Eye       Time Out 06/19/2022. 3:04 PM. Confirmed correct patient, procedure, site, and patient consented.   Anesthesia Topical anesthesia was used. Anesthetic medications included Lidocaine 2%, Proparacaine 0.5%.   Procedure Preparation included 5% betadine to ocular surface, eyelid speculum. A supplied (32g) needle was used.   Injection: 1.25 mg Bevacizumab 1.24m/0.05ml   Route: Intravitreal, Site: Right Eye   NDC: 50242-060-01, Lot:: [id number] Expiration date: 08/08/2022   Post-op Post injection exam found visual acuity of at least counting fingers. The patient tolerated the procedure well. There were no complications. The patient received written and verbal post procedure care education. Post injection medications were not given.      Intravitreal Injection, Pharmacologic Agent - OS - Left Eye       Time Out 06/19/2022. 3:04 PM. Confirmed correct patient, procedure, site, and patient consented.   Anesthesia Topical anesthesia was used. Anesthetic medications included Lidocaine 2%, Proparacaine 0.5%.   Procedure Preparation included 5% betadine to  ocular surface, eyelid speculum. A (32g) needle was used.   Injection: 1.25 mg Bevacizumab 1.224m0.05ml   Route: Intravitreal, Site: Left Eye   NDC: [id number]: : [id number] date: 08/18/2022   Post-op Post injection exam found visual acuity of at least counting fingers. The patient tolerated the procedure well. There were no complications. The patient received written and verbal post procedure care education. Post injection medications were not given.            ASSESSMENT/PLAN:    ICD-10-CM   1. Branch retinal vein occlusion of right eye with macular edema  H34.8310 OCT, Retina - OU - Both Eyes    Intravitreal Injection, Pharmacologic Agent - OD - Right Eye    Bevacizumab (AVASTIN) SOLN 1.25 mg    2. 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 Essential hypertension  I10     4. Hypertensive retinopathy of both eyes  H35.033     5. Combined forms of age-related cataract of both eyes  H25.813      1. BRVO w/ CME OD  - s/p IVA OD #15 (02.15.23), #16 (04.27.23), #17 (05.26.23), #18 (06.23.23) - former pt of Dr. RaZadie Rhine- history of IVA OD x14 from June 2018 to Feb 2020 -- then lost to retina f/u - FA 4.27.23 shows inf macula w/ vascular perfusion defects, telangectasias and mild perivascular leakage - BCVA stable at 20/40 - OCT shows OD: BRVO w/ CME - interval improvement in cystic changes temporal macula, +lamellar macular hole - recommend IVA OD #19 today, 07.21.23 - pt wishes to proceed with injection - RBA of procedure discussed, questions answered - informed consent obtained and signed for IVA OD on 02.15.23 - will check [employer] auth for next appt - see  procedure note - F/U  4 weeks -- DFE/OCT/possible injection  2. Mild Non-proliferative diabetic retinopathy, both eyes  - s/p IVA OS #1 (04.27.23), #2 (05.26.23), #3 (06.23.23)  - A1c: 6.3 on 08/05/21 - exam show scattered MA/DBH OU - OCT shows OS: Interval improvement in IRF IT fovea and macula - FA (04.27.23) shows late leaking MA's OU, no NV OU - recommend IVA OS #4 today, 07.21.23  - pt wishes to proceed with injection - RBA of procedure discussed, questions answered - informed consent obtained and signed for IVA OS 4.27.23 - see procedure note - f/u in 4 wks -- DFE/OCT, possible injection  3,4. Hypertensive retinopathy OU - discussed importance of tight BP control - monitor   5. Mixed Cataract OU - The symptoms of cataract, surgical options, and treatments and risks were discussed with patient. - discussed diagnosis and progression - monitor    Ophthalmic Meds Ordered this visit:  Meds ordered this encounter  Medications   Bevacizumab (AVASTIN) SOLN 1.25 mg   Bevacizumab (AVASTIN) SOLN 1.25 mg     Return in about 4 weeks (around 07/17/2022) for f/u BRVO OD, DFE, OCT.  There are no Patient Instructions on file for this visit.   Explained the diagnoses, plan, and follow up with the patient and they expressed understanding.  Patient expressed understanding of the importance of proper follow up care.   This document serves as a record of services personally performed by Gardiner Sleeper, MD, PhD. It was created on their behalf by Leonie Douglas, an ophthalmic technician. The creation of this record is the provider's dictation and/or activities during the visit.    Electronically signed by: Leonie Douglas COA, 06/21/22  4:37 AM  This document serves as a record of services personally performed by Gardiner Sleeper, MD, PhD. It was created on their behalf by San Jetty. Owens Shark, OA an ophthalmic technician. The creation of this record is the provider's dictation and/or activities during  the visit.    Electronically signed by: San Jetty. Owens Shark, [place] 07.21.2023 4:37 AM   Gardiner Sleeper, M.D., Ph.D. Diseases & Surgery of the Retina and Vitreous [facility]  I have reviewed the above documentation for accuracy and completeness, and I agree with the above. Gardiner Sleeper, M.D., Ph.D. 06/21/22 4:38 AM   Abbreviations: M myopia (nearsighted); A astigmatism; H hyperopia (farsighted); P presbyopia; Mrx spectacle prescription;  CTL contact lenses; OD right eye; OS left eye; OU both eyes  XT exotropia; ET esotropia; PEK punctate epithelial keratitis; PEE punctate epithelial erosions; DES dry eye syndrome; MGD meibomian gland dysfunction; ATs artificial tears; PFAT's preservative free artificial tears; [place] nuclear sclerotic cataract; PSC posterior subcapsular cataract; ERM epi-retinal membrane; PVD posterior vitreous detachment; RD retinal detachment; DM diabetes mellitus; DR diabetic retinopathy; NPDR non-proliferative diabetic retinopathy; PDR proliferative diabetic retinopathy; CSME clinically significant macular edema; DME diabetic macular edema; dbh dot blot hemorrhages; CWS cotton wool spot; POAG primary open angle glaucoma; C/D cup-to-disc ratio; HVF humphrey visual field; GVF goldmann visual field; OCT optical coherence tomography; IOP intraocular pressure; BRVO Branch retinal vein occlusion; CRVO central retinal vein occlusion; CRAO central retinal artery occlusion; BRAO branch retinal artery occlusion; RT retinal tear; SB scleral buckle; PPV pars plana vitrectomy; VH Vitreous hemorrhage; PRP panretinal laser photocoagulation; IVK intravitreal kenalog; VMT vitreomacular traction; MH Macular hole;  NVD neovascularization of the disc; NVE neovascularization elsewhere; AREDS age related eye disease study; ARMD age related macular degeneration; POAG primary open angle glaucoma; EBMD epithelial/anterior basement membrane dystrophy; ACIOL  anterior chamber intraocular lens;  IOL intraocular lens; PCIOL posterior chamber intraocular lens; Phaco/IOL phacoemulsification with intraocular lens placement; [place] photorefractive keratectomy; LASIK laser assisted in situ keratomileusis; HTN hypertension; DM diabetes mellitus; COPD chronic obstructive pulmonary disease

## 2022-06-19 ENCOUNTER — Ambulatory Visit (INDEPENDENT_AMBULATORY_CARE_PROVIDER_SITE_OTHER): Payer: Medicare HMO | Admitting: Ophthalmology

## 2022-06-19 DIAGNOSIS — E113213 Type 2 diabetes mellitus with mild nonproliferative diabetic retinopathy with macular edema, bilateral: Secondary | ICD-10-CM

## 2022-06-19 DIAGNOSIS — H25813 Combined forms of age-related cataract, bilateral: Secondary | ICD-10-CM

## 2022-06-19 DIAGNOSIS — I10 Essential (primary) hypertension: Secondary | ICD-10-CM | POA: Diagnosis not present

## 2022-06-19 DIAGNOSIS — H35033 Hypertensive retinopathy, bilateral: Secondary | ICD-10-CM | POA: Diagnosis not present

## 2022-06-19 DIAGNOSIS — H34831 Tributary (branch) retinal vein occlusion, right eye, with macular edema: Secondary | ICD-10-CM | POA: Diagnosis not present

## 2022-06-21 ENCOUNTER — Encounter (INDEPENDENT_AMBULATORY_CARE_PROVIDER_SITE_OTHER): Payer: Self-pay | Admitting: Ophthalmology

## 2022-06-21 MED ORDER — BEVACIZUMAB CHEMO INJECTION 1.25MG/0.05ML SYRINGE FOR KALEIDOSCOPE
1.2500 mg | INTRAVITREAL | Status: AC | PRN
  Administered 2022-06-21: 1.25 mg via INTRAVITREAL

## 2022-06-30 ENCOUNTER — Encounter: Payer: Self-pay | Admitting: Internal Medicine

## 2022-06-30 ENCOUNTER — Other Ambulatory Visit: Payer: Self-pay | Admitting: Internal Medicine

## 2022-06-30 ENCOUNTER — Ambulatory Visit (INDEPENDENT_AMBULATORY_CARE_PROVIDER_SITE_OTHER): Payer: Medicare HMO | Admitting: Internal Medicine

## 2022-06-30 VITALS — BP 124/68 | HR 88 | Temp 97.9°F | Ht 72.0 in | Wt 192.2 lb

## 2022-06-30 DIAGNOSIS — E538 Deficiency of other specified B group vitamins: Secondary | ICD-10-CM

## 2022-06-30 DIAGNOSIS — E559 Vitamin D deficiency, unspecified: Secondary | ICD-10-CM

## 2022-06-30 DIAGNOSIS — Z125 Encounter for screening for malignant neoplasm of prostate: Secondary | ICD-10-CM | POA: Diagnosis not present

## 2022-06-30 DIAGNOSIS — K219 Gastro-esophageal reflux disease without esophagitis: Secondary | ICD-10-CM

## 2022-06-30 DIAGNOSIS — E1165 Type 2 diabetes mellitus with hyperglycemia: Secondary | ICD-10-CM

## 2022-06-30 DIAGNOSIS — N289 Disorder of kidney and ureter, unspecified: Secondary | ICD-10-CM

## 2022-06-30 DIAGNOSIS — N1832 Chronic kidney disease, stage 3b: Secondary | ICD-10-CM

## 2022-06-30 DIAGNOSIS — Z0001 Encounter for general adult medical examination with abnormal findings: Secondary | ICD-10-CM

## 2022-06-30 DIAGNOSIS — E785 Hyperlipidemia, unspecified: Secondary | ICD-10-CM

## 2022-06-30 LAB — HEPATIC FUNCTION PANEL
ALT: 20 U/L (ref 0–53)
AST: 37 U/L (ref 0–37)
Albumin: 4.1 g/dL (ref 3.5–5.2)
Alkaline Phosphatase: 73 U/L (ref 39–117)
Bilirubin, Direct: 0.1 mg/dL (ref 0.0–0.3)
Total Bilirubin: 0.5 mg/dL (ref 0.2–1.2)
Total Protein: 7.1 g/dL (ref 6.0–8.3)

## 2022-06-30 LAB — CBC WITH DIFFERENTIAL/PLATELET
Basophils Absolute: 0.1 10*3/uL (ref 0.0–0.1)
Basophils Relative: 1.1 % (ref 0.0–3.0)
Eosinophils Absolute: 0.1 10*3/uL (ref 0.0–0.7)
Eosinophils Relative: 3.1 % (ref 0.0–5.0)
HCT: 38.5 % — ABNORMAL LOW (ref 39.0–52.0)
Hemoglobin: 12.7 g/dL — ABNORMAL LOW (ref 13.0–17.0)
Lymphocytes Relative: 33.6 % (ref 12.0–46.0)
Lymphs Abs: 1.6 10*3/uL (ref 0.7–4.0)
MCHC: 32.9 g/dL (ref 30.0–36.0)
MCV: 87 fl (ref 78.0–100.0)
Monocytes Absolute: 0.6 10*3/uL (ref 0.1–1.0)
Monocytes Relative: 11.4 % (ref 3.0–12.0)
Neutro Abs: 2.5 10*3/uL (ref 1.4–7.7)
Neutrophils Relative %: 50.8 % (ref 43.0–77.0)
Platelets: 205 10*3/uL (ref 150.0–400.0)
RBC: 4.43 Mil/uL (ref 4.22–5.81)
RDW: 15.8 % — ABNORMAL HIGH (ref 11.5–15.5)
WBC: 4.9 10*3/uL (ref 4.0–10.5)

## 2022-06-30 LAB — URINALYSIS, ROUTINE W REFLEX MICROSCOPIC
Bilirubin Urine: NEGATIVE
Hgb urine dipstick: NEGATIVE
Ketones, ur: NEGATIVE
Leukocytes,Ua: NEGATIVE
Nitrite: NEGATIVE
Specific Gravity, Urine: >=1.03 — AB (ref 1.000–1.030)
Total Protein, Urine: 30 — AB
Urine Glucose: NEGATIVE
Urobilinogen, UA: 1 (ref 0.0–1.0)
WBC, UA: NONE SEEN (ref 0–?)
pH: 5.5 (ref 5.0–8.0)

## 2022-06-30 LAB — TSH: TSH: 1.62 u[IU]/mL (ref 0.35–5.50)

## 2022-06-30 LAB — VITAMIN B12: Vitamin B-12: 360 pg/mL (ref 211–911)

## 2022-06-30 LAB — LIPID PANEL
Cholesterol: 129 mg/dL (ref 0–200)
HDL: 38.8 mg/dL — ABNORMAL LOW (ref >39.00)
LDL Cholesterol: 81 mg/dL (ref 0–99)
NonHDL: 90.52
Total CHOL/HDL Ratio: 3
Triglycerides: 46 mg/dL (ref 0.0–149.0)
VLDL: 9.2 mg/dL (ref 0.0–40.0)

## 2022-06-30 LAB — MICROALBUMIN / CREATININE URINE RATIO
Creatinine,U: 194.6 mg/dL
Microalb Creat Ratio: 1.1 mg/g (ref 0.0–30.0)
Microalb, Ur: 2.2 mg/dL — ABNORMAL HIGH (ref 0.0–1.9)

## 2022-06-30 LAB — VITAMIN D 25 HYDROXY (VIT D DEFICIENCY, FRACTURES): VITD: 37.4 ng/mL (ref 30.00–100.00)

## 2022-06-30 LAB — BASIC METABOLIC PANEL
BUN: 17 mg/dL (ref 6–23)
CO2: 26 mEq/L (ref 19–32)
Calcium: 9.4 mg/dL (ref 8.4–10.5)
Chloride: 105 mEq/L (ref 96–112)
Creatinine, Ser: 1.59 mg/dL — ABNORMAL HIGH (ref 0.40–1.50)
GFR: 41.17 mL/min — ABNORMAL LOW (ref >60.00)
Glucose, Bld: 79 mg/dL (ref 70–99)
Potassium: 4 mEq/L (ref 3.5–5.1)
Sodium: 140 mEq/L (ref 135–145)

## 2022-06-30 LAB — HEMOGLOBIN A1C: Hgb A1c MFr Bld: 6.4 % (ref 4.6–6.5)

## 2022-06-30 LAB — PSA: PSA: 0.53 ng/mL (ref 0.10–4.00)

## 2022-06-30 NOTE — Patient Instructions (Signed)
Please have your Shingrix (shingles) shots done at your local pharmacy.  Please continue all other medications as before, and refills have been done if requested.  Please have the pharmacy call with any other refills you may need.  Please continue your efforts at being more active, low cholesterol diet, and weight control.  Please keep your appointments with your specialists as you may have planned  Please go to the LAB at the blood drawing area for the tests to be done  You will be contacted by phone if any changes need to be made immediately.  Otherwise, you will receive a letter about your results with an explanation, but please check with MyChart first.  Please remember to sign up for MyChart if you have not done so, as this will be important to you in the future with finding out test results, communicating by private email, and scheduling acute appointments online when needed.  Please make an Appointment to return in 6 months, or sooner if needed, also with Lab Appointment for testing done 3-5 days before at the FIRST FLOOR Lab (so this is for TWO appointments - please see the scheduling desk as you leave)

## 2022-06-30 NOTE — Progress Notes (Unsigned)
Patient ID: Jesus Arnold, male   DOB: 06/28/1941, 82 y.o.   MRN: [id number]        Chief Complaint: follow up HTN, HLD and hyperglycemia ***       HPI:  Jesus Arnold is a 81 y.o. male here with c/o         Wt up about 18 lbs with less walking.  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 No pertinent surgical history.  reports that he quit smoking about 5 years ago. His smoking use included cigarettes. He has never used smokeless tobacco. He reports that he does not drink alcohol and does not use drugs. family history is not on file. No Known Allergies Current Outpatient Medications on File Prior to Visit  Medication Sig Dispense Refill   atorvastatin (LIPITOR) 80 MG tablet Take 1 tablet (80 mg total) by mouth daily. 90 tablet 3   blood glucose meter kit and supplies KIT Dispense based on patient and insurance preference. Use up to four times daily as directed. (E11.9). 1 each 0   glucose blood (ONE TOUCH ULTRA TEST) test strip Use one strip per test. Test blood sugars 1-4 times daily as instructed. 100 each 12   Lancets Misc. (ONE TOUCH SURESOFT) MISC Use 1 lancet per test. Test blood sugars 1-4 times per day as instructed. 1 each 1   metFORMIN (GLUCOPHAGE-XR) 500 MG 24 hr tablet TAKE 3 TABLETS BY MOUTH EVERY DAY WITH BREAKFAST 270 tablet 1   mometasone (ELOCON) 0.1 % cream Apply topically 2 (two) times daily. 45 g 1   Multiple Vitamin (MULTIVITAMIN) tablet Take 1 tablet by mouth daily. 90 tablet 1   rosuvastatin (CRESTOR) 40 MG tablet TAKE 1 TABLET BY MOUTH EVERY DAY 90 tablet 3   pantoprazole (PROTONIX) 40  MG tablet Take 1 tablet (40 mg total) by mouth daily. (Patient not taking: Reported on 02/03/2022) 90 tablet 3   No current facility-administered medications on file prior to visit.        ROS:  All others reviewed and negative.  Objective        PE:  BP 124/68 (BP Location: Left Arm, Patient Position: Sitting, Cuff Size: Normal)   Pulse 88   Temp 97.9 F (36.6 C) (Oral)   Ht 6' (1.829 m)   Wt 192 lb 3.2 oz (87.2 kg)   SpO2 97%   BMI 26.07 kg/m                 Constitutional: Pt appears in NAD               HENT: Head: NCAT.                Right Ear: External ear normal.                 Left Ear: External ear normal.                Eyes: . Pupils are equal, round, and reactive to light. Conjunctivae and EOM are normal  Nose: without d/c or deformity               Neck: Neck supple. Gross normal ROM               Cardiovascular: Normal rate and regular rhythm.                 Pulmonary/Chest: Effort normal and breath sounds without rales or wheezing.                Abd:  Soft, NT, ND, + BS, no organomegaly               Neurological: Pt is alert. At baseline orientation, motor grossly intact               Skin: Skin is warm. No rashes, no other new lesions, LE edema - ***               Psychiatric: Pt behavior is normal without agitation   Micro: none  Cardiac tracings I have personally interpreted today:  none  Pertinent Radiological findings (summarize): none   Lab Results  Component Value Date   WBC 4.7 01/27/2021   HGB 13.7 01/27/2021   HCT 40.6 01/27/2021   PLT 220.0 01/27/2021   GLUCOSE 78 08/05/2021   CHOL 97 08/05/2021   TRIG 56.0 08/05/2021   HDL 40.80 08/05/2021   LDLCALC 45 08/05/2021   ALT 18 08/05/2021   AST 37 08/05/2021   NA 140 08/05/2021   K 4.0 08/05/2021   CL 105 08/05/2021   CREATININE 1.72 (H) 08/05/2021   BUN 16 08/05/2021   CO2 26 08/05/2021   TSH 1.06 01/27/2021   PSA 0.76 01/27/2021   HGBA1C 6.3 08/05/2021   MICROALBUR  22.4 (H) 01/27/2021   Assessment/Plan:  Jesus Arnold is a 81 y.o. Black or African American [2] male with  has a past medical history of Diabetes mellitus without complication ([place]), GERD (gastroesophageal reflux disease), and Neuropathy (11/17/2017).  No problem-specific Assessment & Plan notes found for this encounter.  Followup: No follow-ups on file.  Cathlean Cower, MD 06/30/2022 2:25 PM [facility]

## 2022-07-01 NOTE — Assessment & Plan Note (Signed)
Lab Results  Component Value Date   CREATININE 1.59 (H) 06/30/2022   worseneing overall, cont to avoid nephrotoxins, refer nephrology

## 2022-07-01 NOTE — Assessment & Plan Note (Addendum)
Lab Results  Component Value Date   LDLCALC 81 06/30/2022   Stable, pt to continue current statin lipitor 80 mg qd

## 2022-07-01 NOTE — Assessment & Plan Note (Signed)
Stable, continue protonix 40 qd

## 2022-07-01 NOTE — Assessment & Plan Note (Signed)
Lab Results  Component Value Date   HGBA1C 6.4 06/30/2022   Stable, pt to continue current medical treatment metformin ER 500 mg - 3 qd  

## 2022-07-01 NOTE — Progress Notes (Signed)
Mailed letter to address on file../lmb 

## 2022-07-07 NOTE — Progress Notes (Signed)
[facility] Note  07/17/2022     CHIEF COMPLAINT Patient presents for Retina Follow Up   HISTORY OF PRESENT ILLNESS: Jesus Arnold is a 81 y.o. male who presents to the clinic today for:   HPI     Retina Follow Up   Patient presents with  CRVO/BRVO.  In both eyes.  Severity is moderate.  Duration of 4 weeks.  Since onset it is stable.  I, the attending physician,  performed the HPI with the patient and updated documentation appropriately.        Comments   4 week Retina follow up for Brvo ou. Patient states vision seems the same       Last edited by [name], MD on 07/17/2022  3:41 PM.      Referring physician: Biagio Borg, MD [place],  [place] [address]  HISTORICAL INFORMATION:   Selected notes from the MEDICAL RECORD NUMBER Referred by Dr. [name] for concern of BRVO LEE:  Ocular Hx- former [facility] pt, last visit 2.20.2020 -- BRVO w/ CME, history of multiple IVA (14) from June 2018 to Feb 2020. PMH-    CURRENT MEDICATIONS: No current outpatient medications on file. (Ophthalmic Drugs)   No current facility-administered medications for this visit. (Ophthalmic Drugs)   Current Outpatient Medications (Other)  Medication Sig   atorvastatin (LIPITOR) 80 MG tablet Take 1 tablet (80 mg total) by mouth daily.   blood glucose meter kit and supplies KIT Dispense based on patient and insurance preference. Use up to four times daily as directed. (E11.9).   glucose blood (ONE TOUCH ULTRA TEST) test strip Use one strip per test. Test blood sugars 1-4 times daily as instructed.   Lancets Misc. (ONE TOUCH SURESOFT) MISC Use 1 lancet per test. Test blood sugars 1-4 times per day as instructed.   metFORMIN (GLUCOPHAGE-XR) 500 MG 24 hr tablet TAKE 3 TABLETS BY MOUTH EVERY DAY WITH BREAKFAST   mometasone (ELOCON) 0.1 % cream Apply topically 2 (two) times daily.   Multiple Vitamin (MULTIVITAMIN) tablet Take 1 tablet by mouth daily.    pantoprazole (PROTONIX) 40 MG tablet Take 1 tablet (40 mg total) by mouth daily. (Patient not taking: Reported on 02/03/2022)   No current facility-administered medications for this visit. (Other)   REVIEW OF SYSTEMS: ROS   Positive for: Neurological, Endocrine, Eyes Negative for: Constitutional, Gastrointestinal, Skin, Genitourinary, Musculoskeletal, HENT, Cardiovascular, Respiratory, Psychiatric, Allergic/Imm, Heme/Lymph Last edited by Elmore Guise, COT on 07/17/2022  2:25 PM.      ALLERGIES No Known Allergies  PAST MEDICAL HISTORY Past Medical History:  Diagnosis Date   Diabetes mellitus without complication (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 No pertinent surgical history.  FAMILY HISTORY History reviewed. No pertinent family history.  SOCIAL HISTORY Social History   Tobacco Use   Smoking status: Former    Types: Cigarettes    Quit date: 01/28/2017    Years since quitting: 5.4   Smokeless tobacco: Never  Vaping Use   Vaping Use: Never used  Substance Use Topics   Alcohol use: No   Drug use: No       OPHTHALMIC EXAM:  Base Eye Exam     Visual Acuity (Snellen - Linear)       Right Left   Dist cc 20/40 20/25-2   Dist ph cc 20/NI 20/NI  Correction: Glasses         Tonometry (Tonopen, 2:29 PM)       Right Left   Pressure 12 13         Pupils       Dark Light Shape React APD   Right 3 2 Round Brisk None   Left 3 2 Round Brisk None         Visual Fields (Counting fingers)       Left Right    Full Full         Extraocular Movement       Right Left    Full, Ortho Full, Ortho         Neuro/Psych     Oriented x3: Yes   Mood/Affect: Normal         Dilation     Both eyes: 1.0% Mydriacyl, 2.5% Phenylephrine @ 2:29 PM           Slit Lamp and Fundus Exam     Slit Lamp Exam       Right Left   Lids/Lashes Dermatochalasis - upper lid, Meibomian  gland dysfunction Dermatochalasis - upper lid, Meibomian gland dysfunction   Conjunctiva/Sclera Melanosis Melanosis   Cornea arcus, trace PEE arcus, trace PEE   Anterior Chamber Deep and quiet Deep and quiet   Iris Round and dilated Round and dilated   Lens 2-3+ Nuclear sclerosis, 2-3+ Cortical cataract 2-3+ Nuclear sclerosis, 2-3+ Cortical cataract   Anterior Vitreous Vitreous syneresis, silicone micro bubbles, Posterior vitreous detachment, vitreous condensations Vitreous syneresis, silicone micro bubbles, Posterior vitreous detachment, vitreous condensations         Fundus Exam       Right Left   Disc Pink and Sharp Pink and Sharp, +cupping, mild PPA   C/D Ratio 0.6 0.7   Macula Flat, good foveal reflex, scattered MA/DBH and exudates temporal macula, +edema IT macula - persistent Flat, Good foveal reflex, scattered MA greatest perifovea, mild ERM, mild interval increase in cystic changes   Vessels attenuated, tortuous, early sclerosis distal IT arcades attenuated, Copper wiring, Tortuous, early sclerosis distal IT arcades   Periphery Attached, rare MA/DBH Attached, rare MA/DBH           Refraction     Wearing Rx       Sphere Cylinder Add   Right +1.75 Sphere +2.75   Left +1.75 Sphere +2.75           IMAGING AND PROCEDURES  Imaging and Procedures for 07/17/2022  OCT, Retina - OU - Both Eyes       Right Eye Quality was good. Central Foveal Thickness: 305. Progression has been stable. Findings include no SRF, abnormal foveal contour, intraretinal hyper-reflective material, intraretinal fluid, lamellar hole (Persistent cystic changes temporal macula, +lamellar macular hole).   Left Eye Quality was good. Central Foveal Thickness: 380. Progression has worsened. Findings include no SRF, abnormal foveal contour, intraretinal fluid (Mild Interval increase in IRF/cystic changes IT fovea ).   Notes *Images captured and stored on drive  Diagnosis / Impression:  OD: BRVO w/  CME - persistent cystic changes temporal macula, +lamellar macular hole OS: mild DME --Mild Interval increase in IRF/cystic changes IT fovea   Clinical management:  See below  Abbreviations: NFP - Normal foveal profile. CME - cystoid macular edema. PED - pigment epithelial detachment. IRF - intraretinal fluid. SRF - subretinal fluid. EZ - ellipsoid zone. ERM - epiretinal membrane. ORA - outer retinal atrophy. ORT - outer retinal tubulation.  SRHM - subretinal hyper-reflective material. IRHM - intraretinal hyper-reflective material      Intravitreal Injection, Pharmacologic Agent - OD - Right Eye       Time Out 07/17/2022. 3:08 PM. Confirmed correct patient, procedure, site, and patient consented.   Anesthesia Topical anesthesia was used. Anesthetic medications included Lidocaine 2%, Proparacaine 0.5%.   Procedure Preparation included 5% betadine to ocular surface, eyelid speculum. A supplied (32g) needle was used.   Injection: 1.25 mg Bevacizumab 1.65m/0.05ml   Route: Intravitreal, Site: Right Eye   NDC:: 39767-341-93 Lot: [id number] , Expiration date: 09/01/2022   Post-op Post injection exam found visual acuity of at least counting fingers. The patient tolerated the procedure well. There were no complications. The patient received written and verbal post procedure care education. Post injection medications were not given.      Intravitreal Injection, Pharmacologic Agent - OS - Left Eye       Time Out 07/17/2022. 3:09 PM. Confirmed correct patient, procedure, site, and patient consented.   Anesthesia Topical anesthesia was used. Anesthetic medications included Lidocaine 2%, Proparacaine 0.5%.   Procedure Preparation included 5% betadine to ocular surface, eyelid speculum. A (32g) needle was used.   Injection: 1.25 mg Bevacizumab 1.249m0.05ml   Route: Intravitreal, Site: Left Eye   NDC: 30mLot: : [id number] date: 09/18/2022   Post-op Post injection  exam found visual acuity of at least counting fingers. The patient tolerated the procedure well. There were no complications. The patient received written and verbal post procedure care education. Post injection medications were not given.            ASSESSMENT/PLAN:    ICD-10-CM   1. Branch retinal vein occlusion of right eye with macular edema  H34.8310 OCT, Retina - OU - Both Eyes    Intravitreal Injection, Pharmacologic Agent - OD - Right Eye    Bevacizumab (AVASTIN) SOLN 1.25 mg    2. 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 Essential hypertension  I10     4. Hypertensive retinopathy of both eyes  H35.033     5. Combined forms of age-related cataract of both eyes  H25.813      1. BRVO w/ CME OD  - s/p IVA OD #15 (02.15.23), #16 (04.27.23), #17 (05.26.23), #18 (06.23.23), #19 (07.21.23) - former pt of Dr. Ra08.03.23- history of IVA OD x14 from June 2018 to Feb 2020 -- then lost to retina f/u - FA 4.27.23 shows inf macula w/ vascular perfusion defects, telangectasias and mild perivascular leakage - BCVA stable at 20/40 - OCT shows OD: persistent cystic changes temporal macula, +lamellar macular hole  - discussed IVA resistance and possible switch in medication - recommend IVA OD #20 today, 08.15.23 - pt wishes to proceed with injection - RBA of procedure discussed, questions answered - informed consent obtained and signed for IVA OD on 02.15.23 - will check [employer] auth for next appt - see procedure note - F/U 4 weeks -- DFE/OCT/possible injection  2. Mild Non-proliferative diabetic retinopathy, both eyes  - s/p IVA OS #1 (04.27.23), #2 (05.26.23), #3 (06.23.23), #4 (07.21.23)  - A1c: 6.4 on 06/30/22 - FA (04.27.23) shows late leaking MA's OU, no NV OU - exam shows  scattered MA/DBH OU - OCT shows OS: Mild Interval increase in IRF/cystic changes IT fovea  -  discussed IVA resistance and possible switch in medication - recommend IVA OS #5 today, 08.15.23  - pt wishes to proceed with injection - RBA of procedure discussed, questions answered - informed consent obtained and signed for IVA OS 4.27.23 - see procedure note - will check [employer] auth for next appt - f/u in 4 wks -- DFE/OCT, possible injection  3,4. Hypertensive retinopathy OU - discussed importance of tight BP control - monitor   5. Mixed Cataract OU - The symptoms of cataract, surgical options, and treatments and risks were discussed with patient. - discussed diagnosis and progression - monitor    Ophthalmic Meds Ordered this visit:  Meds ordered this encounter  Medications   Bevacizumab (AVASTIN) SOLN 1.25 mg   Bevacizumab (AVASTIN) SOLN 1.25 mg     Return in about 4 weeks (around 08/14/2022) for f/u NPDR OU, DFE, OCT.  There are no Patient Instructions on file for this visit.   Explained the diagnoses, plan, and follow up with the patient and they expressed understanding.  Patient expressed understanding of the importance of proper follow up care.   This document serves as a record of services personally performed by Gardiner Sleeper, MD, PhD. It was created on their behalf by Renaldo Reel, [place] an ophthalmic technician. The creation of this record is the provider's dictation and/or activities during the visit.    Electronically signed by:  Renaldo Reel, COT  07/07/22 3:50 PM   This document serves as a record of services personally performed by Gardiner Sleeper, MD, PhD. It was created on their behalf by San Jetty. Owens Shark, [place] an ophthalmic technician. The creation of this record is the provider's dictation and/or activities during the visit.    Electronically signed by: San Jetty. Owens Shark, [place] 08.18.2023 3:50 PM  Gardiner Sleeper, M.D., Ph.D. Diseases & Surgery of the Retina  and Vitreous [facility]  I have reviewed the above documentation for accuracy and completeness, and I agree with the above. Gardiner Sleeper, M.D., Ph.D. 07/17/22 3:50 PM  Abbreviations: M myopia (nearsighted); A astigmatism; H hyperopia (farsighted); P presbyopia; Mrx spectacle prescription;  CTL contact lenses; OD right eye; OS left eye; OU both eyes  XT exotropia; ET esotropia; PEK punctate epithelial keratitis; PEE punctate epithelial erosions; DES dry eye syndrome; MGD meibomian gland dysfunction; ATs artificial tears; PFAT's preservative free artificial tears; [place] nuclear sclerotic cataract; PSC posterior subcapsular cataract; ERM epi-retinal membrane; PVD posterior vitreous detachment; RD retinal detachment; DM diabetes mellitus; DR diabetic retinopathy; NPDR non-proliferative diabetic retinopathy; PDR proliferative diabetic retinopathy; CSME clinically significant macular edema; DME diabetic macular edema; dbh dot blot hemorrhages; CWS cotton wool spot; POAG primary open angle glaucoma; C/D cup-to-disc ratio; HVF humphrey visual field; GVF goldmann visual field; OCT optical coherence tomography; IOP intraocular pressure; BRVO Branch retinal vein occlusion; CRVO central retinal vein occlusion; CRAO central retinal artery occlusion; BRAO branch retinal artery occlusion; RT retinal tear; SB scleral buckle; PPV pars plana vitrectomy; VH Vitreous hemorrhage; PRP panretinal laser photocoagulation; IVK intravitreal kenalog; VMT vitreomacular traction; MH Macular hole;  NVD neovascularization of the disc; NVE neovascularization elsewhere; AREDS age related eye disease study; ARMD age related macular degeneration; POAG primary open angle glaucoma; EBMD epithelial/anterior basement membrane dystrophy; ACIOL anterior chamber intraocular lens; IOL intraocular lens; PCIOL posterior chamber intraocular lens; Phaco/IOL phacoemulsification with intraocular lens placement; [place] photorefractive  keratectomy; LASIK laser assisted in situ keratomileusis; HTN hypertension; DM diabetes mellitus; COPD chronic obstructive pulmonary disease

## 2022-07-09 ENCOUNTER — Other Ambulatory Visit: Payer: Self-pay | Admitting: Internal Medicine

## 2022-07-09 NOTE — Telephone Encounter (Signed)
Please refill as per office routine med refill policy (all routine meds to be refilled for 3 mo or monthly (per pt preference) up to one year from last visit, then month to month grace period for 3 mo, then further med refills will have to be denied) ? ?

## 2022-07-17 ENCOUNTER — Ambulatory Visit (INDEPENDENT_AMBULATORY_CARE_PROVIDER_SITE_OTHER): Payer: Medicare HMO | Admitting: Ophthalmology

## 2022-07-17 ENCOUNTER — Encounter (INDEPENDENT_AMBULATORY_CARE_PROVIDER_SITE_OTHER): Payer: Self-pay | Admitting: Ophthalmology

## 2022-07-17 DIAGNOSIS — H34831 Tributary (branch) retinal vein occlusion, right eye, with macular edema: Secondary | ICD-10-CM | POA: Diagnosis not present

## 2022-07-17 DIAGNOSIS — H35033 Hypertensive retinopathy, bilateral: Secondary | ICD-10-CM

## 2022-07-17 DIAGNOSIS — H25813 Combined forms of age-related cataract, bilateral: Secondary | ICD-10-CM | POA: Diagnosis not present

## 2022-07-17 DIAGNOSIS — I10 Essential (primary) hypertension: Secondary | ICD-10-CM | POA: Diagnosis not present

## 2022-07-17 DIAGNOSIS — E113213 Type 2 diabetes mellitus with mild nonproliferative diabetic retinopathy with macular edema, bilateral: Secondary | ICD-10-CM | POA: Diagnosis not present

## 2022-07-17 MED ORDER — BEVACIZUMAB CHEMO INJECTION 1.25MG/0.05ML SYRINGE FOR KALEIDOSCOPE
1.2500 mg | INTRAVITREAL | Status: AC | PRN
  Administered 2022-07-17: 1.25 mg via INTRAVITREAL

## 2022-07-31 NOTE — Progress Notes (Signed)
[facility] Note  08/14/2022     CHIEF COMPLAINT Patient presents for Retina Follow Up   HISTORY OF PRESENT ILLNESS: Jesus Arnold is a 81 y.o. male who presents to the clinic today for:   HPI     Retina Follow Up   Patient presents with  CRVO/BRVO.  In both eyes.  This started months ago.  Severity is moderate.  Duration of 4 weeks.  Since onset it is stable.  I, the attending physician,  performed the HPI with the patient and updated documentation appropriately.        Comments   Patient denies noticing nay vision changes at this time. His blood sugar was 111 and his A1C he is unsure of.       Last edited by [name], MD on 08/14/2022 11:32 PM.     Referring physician: Biagio Borg, MD [place],  [place] [id number]  HISTORICAL INFORMATION:   Selected notes from the MEDICAL RECORD NUMBER Referred by Dr. [name] for concern of BRVO LEE:  Ocular Hx- former [facility] pt, last visit 2.20.2020 -- BRVO w/ CME, history of multiple IVA (14) from June 2018 to Feb 2020. PMH-    CURRENT MEDICATIONS: No current outpatient medications on file. (Ophthalmic Drugs)   No current facility-administered medications for this visit. (Ophthalmic Drugs)   Current Outpatient Medications (Other)  Medication Sig   atorvastatin (LIPITOR) 80 MG tablet Take 1 tablet (80 mg total) by mouth daily.   blood glucose meter kit and supplies KIT Dispense based on patient and insurance preference. Use up to four times daily as directed. (E11.9).   glucose blood (ONE TOUCH ULTRA TEST) test strip Use one strip per test. Test blood sugars 1-4 times daily as instructed.   Lancets Misc. (ONE TOUCH SURESOFT) MISC Use 1 lancet per test. Test blood sugars 1-4 times per day as instructed.   metFORMIN (GLUCOPHAGE-XR) 500 MG 24 hr tablet TAKE 3 TABLETS BY MOUTH EVERY DAY WITH BREAKFAST   mometasone (ELOCON) 0.1 % cream Apply topically 2 (two) times daily.   Multiple  Vitamin (MULTIVITAMIN) tablet Take 1 tablet by mouth daily.   pantoprazole (PROTONIX) 40 MG tablet Take 1 tablet (40 mg total) by mouth daily. (Patient not taking: Reported on 02/03/2022)   No current facility-administered medications for this visit. (Other)   REVIEW OF SYSTEMS: ROS   Positive for: Neurological, Endocrine, Eyes Negative for: Constitutional, Gastrointestinal, Skin, Genitourinary, Musculoskeletal, HENT, Cardiovascular, Respiratory, Psychiatric, Allergic/Imm, Heme/Lymph Last edited by Annie Paras, COT on 08/14/2022  1:56 PM.     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 No pertinent surgical history.  FAMILY HISTORY History reviewed. No pertinent family history.  SOCIAL HISTORY Social History   Tobacco Use   Smoking status: Former    Types: Cigarettes    Quit date: 01/28/2017    Years since quitting: 5.5   Smokeless tobacco: Never  Vaping Use   Vaping Use: Never used  Substance Use Topics   Alcohol use: No   Drug use: No       OPHTHALMIC EXAM:  Base Eye Exam     Visual Acuity (Snellen - Linear)       Right Left   Dist cc 20/40 +1 20/25  Dist ph cc NI NI    Correction: Glasses         Tonometry (Tonopen, 1:59 PM)       Right Left   Pressure 13 13         Pupils       Dark Light Shape React APD   Right 3 2 Round Brisk None   Left 3 2 Round Brisk None         Visual Fields       Left Right    Full Full         Extraocular Movement       Right Left    Full, Ortho Full, Ortho         Neuro/Psych     Oriented x3: Yes   Mood/Affect: Normal         Dilation     Both eyes: 1.0% Mydriacyl, 2.5% Phenylephrine @ 1:56 PM           Slit Lamp and Fundus Exam     Slit Lamp Exam       Right Left    Lids/Lashes Dermatochalasis - upper lid, Meibomian gland dysfunction Dermatochalasis - upper lid, Meibomian gland dysfunction   Conjunctiva/Sclera Melanosis Melanosis   Cornea arcus, trace PEE arcus, trace PEE   Anterior Chamber Deep and quiet Deep and quiet   Iris Round and dilated Round and dilated   Lens 2-3+ Nuclear sclerosis, 2-3+ Cortical cataract 2-3+ Nuclear sclerosis, 2-3+ Cortical cataract   Anterior Vitreous Vitreous syneresis, silicone micro bubbles, Posterior vitreous detachment, vitreous condensations Vitreous syneresis, silicone micro bubbles, Posterior vitreous detachment, vitreous condensations         Fundus Exam       Right Left   Disc Pink and Sharp Pink and Sharp, +cupping, mild PPA   C/D Ratio 0.6 0.7   Macula Flat, good foveal reflex, scattered MA/DBH and exudates temporal macula, +edema IT macula - persistent Flat, Good foveal reflex, scattered MA greatest perifovea, mild ERM, persistent cystic changes   Vessels attenuated, tortuous, early sclerosis distal IT arcades attenuated, Copper wiring, Tortuous, early sclerosis distal IT arcades   Periphery Attached, rare MA/DBH Attached, rare MA/DBH           Refraction     Wearing Rx       Sphere Cylinder Add   Right +1.75 Sphere +2.75   Left +1.75 Sphere +2.75           IMAGING AND PROCEDURES  Imaging and Procedures for 08/14/2022  OCT, Retina - OU - Both Eyes       Right Eye Quality was good. Central Foveal Thickness: 310. Progression has been stable. Findings include no SRF, abnormal foveal contour, intraretinal hyper-reflective material, intraretinal fluid, lamellar hole (Persistent cystic changes temporal macula-- minimal improvement +lamellar macular hole).   Left Eye Quality was good. Central Foveal Thickness: 278. Progression has been stable. Findings include no SRF, abnormal foveal contour, intraretinal fluid (Persistent IRF/cystic changes IT fovea -- minimal improvement ).   Notes *Images  captured and stored on drive  Diagnosis / Impression:  OD: BRVO w/ CME - Persistent cystic changes temporal macula-- minimal improvement +lamellar macular hole OS: mild DME --Persistent IRF/cystic changes IT fovea -- minimal improvement   Clinical management:  See below  Abbreviations: NFP - Normal foveal profile. CME - cystoid macular edema. PED - pigment epithelial detachment. IRF - intraretinal fluid. SRF - subretinal fluid. EZ - ellipsoid zone. ERM - epiretinal membrane. ORA -  outer retinal atrophy. ORT - outer retinal tubulation. SRHM - subretinal hyper-reflective material. IRHM - intraretinal hyper-reflective material      Intravitreal Injection, Pharmacologic Agent - OD - Right Eye       Time Out 08/14/2022. 3:05 PM. Confirmed correct patient, procedure, site, and patient consented.   Anesthesia Topical anesthesia was used. Anesthetic medications included Lidocaine 2%, Proparacaine 0.5%.   Procedure Preparation included 5% betadine to ocular surface, eyelid speculum. A supplied (32g) needle was used.   Injection: 2 mg aflibercept 2 MG/0.05ML   Route: Intravitreal, Site: Right Eye   NDC: A3590391, Lot: [id number], Expiration date: 08/30/2023, Waste: 0 mL   Post-op Post injection exam found visual acuity of at least counting fingers. The patient tolerated the procedure well. There were no complications. The patient received written and verbal post procedure care education. Post injection medications were not given.      Intravitreal Injection, Pharmacologic Agent - OS - Left Eye       Time Out 08/14/2022. 3:06 PM. Confirmed correct patient, procedure, site, and patient consented.   Anesthesia Topical anesthesia was used. Anesthetic medications included Lidocaine 2%, Proparacaine 0.5%.   Procedure Preparation included 5% betadine to ocular surface, eyelid speculum. A (32g) needle was used.   Injection: 2 mg aflibercept 2 MG/0.05ML   Route: Intravitreal, Site:  Left Eye   NDC: A3590391, Lot: [id number], Expiration date: 09/30/2023, Waste: 0 mL   Post-op Post injection exam found visual acuity of at least counting fingers. The patient tolerated the procedure well. There were no complications. The patient received written and verbal post procedure care education. Post injection medications were not given.            ASSESSMENT/PLAN:    ICD-10-CM   1. Branch retinal vein occlusion of right eye with macular edema  H34.8310 OCT, Retina - OU - Both Eyes    Intravitreal Injection, Pharmacologic Agent - OD - Right Eye    aflibercept (EYLEA) SOLN 2 mg    2. 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 Essential hypertension  I10     4. Hypertensive retinopathy of both eyes  H35.033     5. Combined forms of age-related cataract of both eyes  H25.813      1. BRVO w/ CME OD  - s/p IVA OD #15 (02.15.23), #16 (04.27.23), #17 (05.26.23), #18 (06.23.23), #19 (07.21.23), #20 (08.15.23) - former pt of Dr. [name] -- history of IVA OD x14 from June 2018 to Feb 2020 -- then lost to retina f/u - FA 4.27.23 shows inf macula w/ vascular perfusion defects, telangectasias and mild perivascular leakage - BCVA stable at 20/40 - stable - OCT shows OD: Persistent cystic changes temporal macula-- minimal improvement +lamellar macular hole - discussed IVA resistance and possible switch in medication - recommend IVE OD #1 today, 09.15.23 - pt wishes to proceed with injection - RBA of procedure discussed, questions answered - informed consent obtained and signed for IVA OD on 02.15.23, IVE OD 9.15.23 - see procedure note - F/U 4 weeks --  DFE/OCT/possible injection  2. Mild Non-proliferative diabetic retinopathy, both eyes  - s/p  IVA OS #1 (04.27.23), #2 (05.26.23), #3 (06.23.23), #4 (07.21.23), #5 (08.15.23)  - A1c: 6.4 on 06/30/22 - FA (04.27.23) shows late leaking MA's OU, no NV OU - exam shows scattered MA/DBH OU - OCT shows OS: Persistent IRF/cystic changes IT fovea -- minimal improvement  - discussed IVA resistance and possible switch in medication - recommend IVE OS #1 today, 09.15.23 - pt wishes to proceed with injection - RBA of procedure discussed, questions answered - informed consent obtained and signed for IVA OS 4.27.23, IVE OS 9.15.23 - see procedure note - f/u in 4 wks -- DFE/OCT, possible injection  3,4. Hypertensive retinopathy OU - discussed importance of tight BP control - continue to monitor  5. Mixed Cataract OU - The symptoms of cataract, surgical options, and treatments and risks were discussed with patient. - discussed diagnosis and progression - continue to monitor  Ophthalmic Meds Ordered this visit:  Meds ordered this encounter  Medications   aflibercept (EYLEA) SOLN 2 mg   aflibercept (EYLEA) SOLN 2 mg     Return in about 4 weeks (around 09/11/2022) for f/u NPDR OU , DFE, OCT, Possible, IVE, OU.  There are no Patient Instructions on file for this visit.   Explained the diagnoses, plan, and follow up with the patient and they expressed understanding.  Patient expressed understanding of the importance of proper follow up care.   This document serves as a record of services personally performed by Gardiner Sleeper, MD, PhD. It was created on their behalf by Renaldo Reel, [place] an ophthalmic technician. The creation of this record is the provider's dictation and/or activities during the visit.    Electronically signed by:  Renaldo Reel, COT  08/14/22 11:33 PM   Gardiner Sleeper, M.D., Ph.D. Diseases & Surgery of the Retina and Vitreous [facility]  I have reviewed the above documentation for accuracy and completeness, and I agree with the above. Gardiner Sleeper, M.D., Ph.D. 08/14/22 11:35 PM   Abbreviations: M myopia (nearsighted); A astigmatism; H hyperopia (farsighted); P presbyopia; Mrx spectacle prescription;  CTL contact lenses; OD right eye; OS left eye; OU both eyes  XT exotropia; ET esotropia; PEK punctate epithelial keratitis; PEE punctate epithelial erosions; DES dry eye syndrome; MGD meibomian gland dysfunction; ATs artificial tears; PFAT's preservative free artificial tears; [place] nuclear sclerotic cataract; PSC posterior subcapsular cataract; ERM epi-retinal membrane; PVD posterior vitreous detachment; RD retinal detachment; DM diabetes mellitus; DR diabetic retinopathy; NPDR non-proliferative diabetic retinopathy; PDR proliferative diabetic retinopathy; CSME clinically significant macular edema; DME diabetic macular edema; dbh dot blot hemorrhages; CWS cotton wool spot; POAG primary open angle glaucoma; C/D cup-to-disc ratio; HVF humphrey visual field; GVF goldmann visual field; OCT optical coherence tomography; IOP intraocular pressure; BRVO Branch retinal vein occlusion; CRVO central retinal vein occlusion; CRAO central retinal artery occlusion; BRAO branch retinal artery occlusion; RT retinal tear; SB scleral buckle; PPV pars plana vitrectomy; VH Vitreous hemorrhage; PRP panretinal laser photocoagulation; IVK intravitreal kenalog; VMT vitreomacular traction; MH Macular hole;  NVD neovascularization of the disc; NVE neovascularization elsewhere; AREDS age related eye disease study; ARMD age related macular degeneration; POAG primary open angle glaucoma; EBMD epithelial/anterior basement membrane dystrophy; ACIOL anterior chamber intraocular lens; IOL intraocular lens; PCIOL posterior chamber intraocular lens; Phaco/IOL phacoemulsification with intraocular lens placement; [place] photorefractive keratectomy; LASIK laser assisted in situ  keratomileusis; HTN hypertension; DM diabetes mellitus; COPD chronic obstructive pulmonary disease

## 2022-08-07 ENCOUNTER — Ambulatory Visit (INDEPENDENT_AMBULATORY_CARE_PROVIDER_SITE_OTHER): Payer: Medicare HMO | Admitting: Internal Medicine

## 2022-08-07 VITALS — BP 122/64 | HR 83 | Temp 98.0°F | Ht 72.0 in | Wt 194.0 lb

## 2022-08-07 DIAGNOSIS — E538 Deficiency of other specified B group vitamins: Secondary | ICD-10-CM | POA: Diagnosis not present

## 2022-08-07 DIAGNOSIS — E1165 Type 2 diabetes mellitus with hyperglycemia: Secondary | ICD-10-CM | POA: Diagnosis not present

## 2022-08-07 DIAGNOSIS — N1831 Chronic kidney disease, stage 3a: Secondary | ICD-10-CM

## 2022-08-07 DIAGNOSIS — Z0001 Encounter for general adult medical examination with abnormal findings: Secondary | ICD-10-CM | POA: Diagnosis not present

## 2022-08-07 DIAGNOSIS — E785 Hyperlipidemia, unspecified: Secondary | ICD-10-CM | POA: Diagnosis not present

## 2022-08-07 DIAGNOSIS — Z23 Encounter for immunization: Secondary | ICD-10-CM | POA: Diagnosis not present

## 2022-08-07 DIAGNOSIS — E559 Vitamin D deficiency, unspecified: Secondary | ICD-10-CM | POA: Insufficient documentation

## 2022-08-07 NOTE — Progress Notes (Unsigned)
Patient ID: Jesus Arnold, male   DOB: 1941-11-26, 81 y.o.   MRN: [id number]         Chief Complaint:: wellness exam and worsening CKD, DM, low vit D       HPI:  Jesus Arnold is a 81 y.o. male here for wellness exam; declines shingrix and pneumovax for now but may have done at the pharmacy; o/w up to date               Also Pt denies chest pain, increased sob or doe, wheezing, orthopnea, PND, increased LE swelling, palpitations, dizziness or syncope.   Pt denies polydipsia, polyuria, or new focal neuro s/s.   Pt denies fever, wt loss, night sweats, loss of appetite, or other constitutional symptoms  Has some occasional loose stool off an on for several months, without worsening pain, fever or blood.  Denies worsening reflux, abd pain, dysphagia, n/v.     Wt Readings from Last 3 Encounters:  08/07/22 194 lb (88 kg)  06/30/22 192 lb 3.2 oz (87.2 kg)  08/05/21 175 lb (79.4 kg)   BP Readings from Last 3 Encounters:  08/07/22 122/64  06/30/22 124/68  02/03/22 126/70   Immunization History  Administered Date(s) Administered   Fluad Quad(high Dose 65+) 08/28/2019, 01/27/2021, 08/07/2022   Influenza, High Dose Seasonal PF 09/30/2018   Influenza-Unspecified 09/30/2018   PFIZER(Purple Top)SARS-COV-2 Vaccination 05/14/2020, 06/04/2020, 03/05/2021   Pneumococcal Conjugate-13 11/17/2017, 05/09/2020   Tdap 11/17/2017   There are no preventive care reminders to display for this patient.     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 No pertinent surgical history.  reports that he quit smoking about 5 years ago. His smoking use included cigarettes. He has never used smokeless tobacco. He reports that he does not drink alcohol and does not use drugs. family history is not on file. No Known Allergies Current Outpatient Medications on File Prior to  Visit  Medication Sig Dispense Refill   atorvastatin (LIPITOR) 80 MG tablet Take 1 tablet (80 mg total) by mouth daily. 90 tablet 3   blood glucose meter kit and supplies KIT Dispense based on patient and insurance preference. Use up to four times daily as directed. (E11.9). 1 each 0   glucose blood (ONE TOUCH ULTRA TEST) test strip Use one strip per test. Test blood sugars 1-4 times daily as instructed. 100 each 12   Lancets Misc. (ONE TOUCH SURESOFT) MISC Use 1 lancet per test. Test blood sugars 1-4 times per day as instructed. 1 each 1   metFORMIN (GLUCOPHAGE-XR) 500 MG 24 hr tablet TAKE 3 TABLETS BY MOUTH EVERY DAY WITH BREAKFAST 270 tablet 1   mometasone (ELOCON) 0.1 % cream Apply topically 2 (two) times daily. 45 g 1   Multiple Vitamin (MULTIVITAMIN) tablet Take 1 tablet by mouth daily. 90 tablet 1   pantoprazole (PROTONIX) 40 MG tablet Take 1 tablet (40 mg total) by mouth daily. (Patient not taking: Reported on 02/03/2022) 90 tablet 3   No current facility-administered medications on file prior to visit.        ROS:  All others reviewed and negative.  Objective        PE:  BP 122/64 (BP Location: Right Arm, Patient Position: Bed low/side rails up, Cuff Size: Large)   Pulse 83   Temp 98 F (36.7 C) (  Oral)   Ht 6' (1.829 m)   Wt 194 lb (88 kg)   SpO2 94%   BMI 26.31 kg/m                 Constitutional: Pt appears in NAD               HENT: Head: NCAT.                Right Ear: External ear normal.                 Left Ear: External ear normal.                Eyes: . Pupils are equal, round, and reactive to light. Conjunctivae and EOM are normal               Nose: without d/c or deformity               Neck: Neck supple. Gross normal ROM               Cardiovascular: Normal rate and regular rhythm.                 Pulmonary/Chest: Effort normal and breath sounds without rales or wheezing.                Abd:  Soft, NT, ND, + BS, no organomegaly               Neurological: Pt  is alert. At baseline orientation, motor grossly intact               Skin: Skin is warm. No rashes, no other new lesions, LE edema - none               Psychiatric: Pt behavior is normal without agitation   Micro: none  Cardiac tracings I have personally interpreted today:  none  Pertinent Radiological findings (summarize): none   Lab Results  Component Value Date   WBC 4.9 06/30/2022   HGB 12.7 (L) 06/30/2022   HCT 38.5 (L) 06/30/2022   PLT 205.0 06/30/2022   GLUCOSE 79 06/30/2022   CHOL 129 06/30/2022   TRIG 46.0 06/30/2022   HDL 38.80 (L) 06/30/2022   LDLCALC 81 06/30/2022   ALT 20 06/30/2022   AST 37 06/30/2022   NA 140 06/30/2022   K 4.0 06/30/2022   CL 105 06/30/2022   CREATININE 1.59 (H) 06/30/2022   BUN 17 06/30/2022   CO2 26 06/30/2022   TSH 1.62 06/30/2022   PSA 0.53 06/30/2022   HGBA1C 6.4 06/30/2022   MICROALBUR 2.2 (H) 06/30/2022   Assessment/Plan:  Jesus Arnold is a 81 y.o. Black or African American [2] male with  has a past medical history of Diabetes mellitus without complication ([place]), GERD (gastroesophageal reflux disease), and Neuropathy (11/17/2017).  Encounter for well adult exam with abnormal findings Age and sex appropriate education and counseling updated with regular exercise and diet Referrals for preventative services - none needed Immunizations addressed - for flu shot today Smoking counseling  - none needed Evidence for depression or other mood disorder - none significant Most recent labs reviewed. I have personally reviewed and have noted: 1) the patient's medical and social history 2) The patient's current medications and supplements 3) The patient's height, weight, and BMI have been recorded in the chart   CKD (chronic kidney disease) stage 3, GFR 30-59 ml/min (HCC) Lab Results  Component Value Date   CREATININE  1.59 (H) 06/30/2022   Worsening despite good BP control,, cont to avoid nephrotoxins, for renal referral  Type 2  diabetes mellitus ([place]) Lab Results  Component Value Date   HGBA1C 6.4 06/30/2022   Stable, pt to continue current medical treatment metformin ER 500 mg - 3 qd   Vitamin D deficiency Last vitamin D Lab Results  Component Value Date   VD25OH 37.40 06/30/2022   Low, to start oral replacement   Dyslipidemia Lab Results  Component Value Date   LDLCALC 81 06/30/2022   Stable, pt to continue current statin lipitor 80 mg qd  Followup: Return in about 6 months (around 02/05/2023).  Cathlean Cower, MD 08/09/2022 2:42 PM [facility]

## 2022-08-07 NOTE — Patient Instructions (Addendum)
You had the flu shot today  You will be contacted regarding the referral for: kidney doctor  Ok to use the OTC Immodium to help the bowel situation  Please continue all other medications as before, and refills have been done if requested.  Please have the pharmacy call with any other refills you may need.  Please continue your efforts at being more active, low cholesterol diet, and weight control.  You are otherwise up to date with prevention measures today.  Please keep your appointments with your specialists as you may have planned  Please make an Appointment to return in 6 months, or sooner if needed, also with Lab Appointment for testing done 3-5 days before at the FIRST FLOOR Lab (so this is for TWO appointments - please see the scheduling desk as you leave)

## 2022-08-09 ENCOUNTER — Encounter: Payer: Self-pay | Admitting: Internal Medicine

## 2022-08-09 NOTE — Assessment & Plan Note (Signed)
Lab Results  Component Value Date   HGBA1C 6.4 06/30/2022   Stable, pt to continue current medical treatment metformin ER 500 mg - 3 qd  

## 2022-08-09 NOTE — Addendum Note (Signed)
Addended by: Corwin Levins on: 08/09/2022 02:44 PM   Modules accepted: Orders

## 2022-08-09 NOTE — Assessment & Plan Note (Signed)
Last vitamin D Lab Results  Component Value Date   VD25OH 37.40 06/30/2022   Low, to start oral replacement  

## 2022-08-09 NOTE — Assessment & Plan Note (Signed)
Lab Results  Component Value Date   LDLCALC 81 06/30/2022   Stable, pt to continue current statin lipitor 80 mg qd  

## 2022-08-09 NOTE — Assessment & Plan Note (Signed)
Age and sex appropriate education and counseling updated with regular exercise and diet Referrals for preventative services - none needed Immunizations addressed - for flu shot today Smoking counseling  - none needed Evidence for depression or other mood disorder - none significant Most recent labs reviewed. I have personally reviewed and have noted: 1) the patient's medical and social history 2) The patient's current medications and supplements 3) The patient's height, weight, and BMI have been recorded in the chart  

## 2022-08-09 NOTE — Assessment & Plan Note (Signed)
Lab Results  Component Value Date   CREATININE 1.59 (H) 06/30/2022   Worsening despite good BP control,, cont to avoid nephrotoxins, for renal referral

## 2022-08-14 ENCOUNTER — Encounter (INDEPENDENT_AMBULATORY_CARE_PROVIDER_SITE_OTHER): Payer: Self-pay | Admitting: Ophthalmology

## 2022-08-14 ENCOUNTER — Ambulatory Visit (INDEPENDENT_AMBULATORY_CARE_PROVIDER_SITE_OTHER): Payer: Medicare HMO | Admitting: Ophthalmology

## 2022-08-14 DIAGNOSIS — E113213 Type 2 diabetes mellitus with mild nonproliferative diabetic retinopathy with macular edema, bilateral: Secondary | ICD-10-CM

## 2022-08-14 DIAGNOSIS — H34831 Tributary (branch) retinal vein occlusion, right eye, with macular edema: Secondary | ICD-10-CM | POA: Diagnosis not present

## 2022-08-14 DIAGNOSIS — I10 Essential (primary) hypertension: Secondary | ICD-10-CM | POA: Diagnosis not present

## 2022-08-14 DIAGNOSIS — H25813 Combined forms of age-related cataract, bilateral: Secondary | ICD-10-CM

## 2022-08-14 DIAGNOSIS — H35033 Hypertensive retinopathy, bilateral: Secondary | ICD-10-CM | POA: Diagnosis not present

## 2022-08-14 MED ORDER — AFLIBERCEPT 2MG/0.05ML IZ SOLN FOR KALEIDOSCOPE
2.0000 mg | INTRAVITREAL | Status: AC | PRN
  Administered 2022-08-14: 2 mg via INTRAVITREAL

## 2022-08-28 NOTE — Progress Notes (Signed)
[facility] Note  09/11/2022     CHIEF COMPLAINT Patient presents for Retina Follow Up   HISTORY OF PRESENT ILLNESS: Jesus Arnold is a 81 y.o. male who presents to the clinic today for:   HPI     Retina Follow Up   Patient presents with  CRVO/BRVO.  In both eyes.  This started months ago.  Severity is moderate.  Duration of 4 weeks.  Since onset it is stable.  I, the attending physician,  performed the HPI with the patient and updated documentation appropriately.        Comments   Patient denies noticing any vision changes at this time. His blood sugar was 83 and he is unsure of his A1C.       Last edited by [name], MD on 09/12/2022 11:31 AM.    Pt states no change in vision, pts blood sugar was 83 this morning  Referring physician: Biagio Borg, MD [place],  [place] [id number]  HISTORICAL INFORMATION:   Selected notes from the MEDICAL RECORD NUMBER Referred by Dr. [name] for concern of BRVO LEE:  Ocular Hx- former [facility] pt, last visit 2.20.2020 -- BRVO w/ CME, history of multiple IVA (14) from June 2018 to Feb 2020. PMH-    CURRENT MEDICATIONS: No current outpatient medications on file. (Ophthalmic Drugs)   No current facility-administered medications for this visit. (Ophthalmic Drugs)   Current Outpatient Medications (Other)  Medication Sig   atorvastatin (LIPITOR) 80 MG tablet Take 1 tablet (80 mg total) by mouth daily.   blood glucose meter kit and supplies KIT Dispense based on patient and insurance preference. Use up to four times daily as directed. (E11.9).   glucose blood (ONE TOUCH ULTRA TEST) test strip Use one strip per test. Test blood sugars 1-4 times daily as instructed.   Lancets Misc. (ONE TOUCH SURESOFT) MISC Use 1 lancet per test. Test blood sugars 1-4 times per day as instructed.   metFORMIN (GLUCOPHAGE-XR) 500 MG 24 hr tablet TAKE 3 TABLETS BY MOUTH EVERY DAY WITH BREAKFAST   mometasone  (ELOCON) 0.1 % cream Apply topically 2 (two) times daily.   Multiple Vitamin (MULTIVITAMIN) tablet Take 1 tablet by mouth daily.   pantoprazole (PROTONIX) 40 MG tablet Take 1 tablet (40 mg total) by mouth daily. (Patient not taking: Reported on 02/03/2022)   No current facility-administered medications for this visit. (Other)   REVIEW OF SYSTEMS: ROS   Positive for: Neurological, Endocrine, Eyes Negative for: Constitutional, Gastrointestinal, Skin, Genitourinary, Musculoskeletal, HENT, Cardiovascular, Respiratory, Psychiatric, Allergic/Imm, Heme/Lymph Last edited by Annie Paras, COT on 09/11/2022  1:57 PM.     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 No pertinent surgical history.  FAMILY HISTORY History reviewed. No pertinent family history.  SOCIAL HISTORY Social History   Tobacco Use   Smoking status: Former    Types: Cigarettes    Quit date: 01/28/2017    Years since quitting: 5.6   Smokeless tobacco: Never  Vaping Use   Vaping Use: Never used  Substance Use Topics   Alcohol use: No   Drug use: No       OPHTHALMIC EXAM:  Base Eye Exam     Visual Acuity (Snellen - Linear)  Right Left   Dist cc 20/60 20/40   Dist ph cc 20/50 20/30    Correction: Glasses         Tonometry (Tonopen, 2:07 PM)       Right Left   Pressure 15 15         Pupils       Dark Light Shape React APD   Right 2 2 Round Minimal None   Left 2 2 Round Minimal None         Visual Fields       Left Right    Full Full         Extraocular Movement       Right Left    Full, Ortho Full, Ortho         Neuro/Psych     Oriented x3: Yes   Mood/Affect: Normal         Dilation     Both eyes: 1.0% Mydriacyl, 2.5% Phenylephrine @ 1:58 PM            Slit Lamp and Fundus Exam     Slit Lamp Exam       Right Left   Lids/Lashes Dermatochalasis - upper lid, Meibomian gland dysfunction Dermatochalasis - upper lid, Meibomian gland dysfunction   Conjunctiva/Sclera Melanosis Melanosis   Cornea arcus, trace PEE arcus, trace PEE   Anterior Chamber Deep and quiet Deep and quiet   Iris Round and dilated Round and dilated   Lens 2-3+ Nuclear sclerosis, 2-3+ Cortical cataract 2-3+ Nuclear sclerosis, 2-3+ Cortical cataract   Anterior Vitreous Vitreous syneresis, silicone micro bubbles, Posterior vitreous detachment, vitreous condensations Vitreous syneresis, silicone micro bubbles, Posterior vitreous detachment, vitreous condensations         Fundus Exam       Right Left   Disc Pink and Sharp Pink and Sharp, +cupping, mild PPA   C/D Ratio 0.6 0.7   Macula Flat, good foveal reflex, scattered MA/DBH and exudates temporal macula, +edema IT macula - persistent / slightly improved Flat, Good foveal reflex, scattered MA greatest perifovea, mild ERM, persistent cystic changes   Vessels attenuated, tortuous, early sclerosis distal IT arcades attenuated, Tortuous   Periphery Attached, rare MA/DBH Attached, rare MA/DBH           Refraction     Wearing Rx       Sphere Cylinder Add   Right +1.75 Sphere +2.75   Left +1.75 Sphere +2.75         Manifest Refraction       Sphere Cylinder Axis Dist VA   Right +3.00 +0.25 180 20/30+2   Left +3.00 +0.25 160 20/20+2           IMAGING AND PROCEDURES  Imaging and Procedures for 09/11/2022  OCT, Retina - OU - Both Eyes       Right Eye Quality was good. Central Foveal Thickness: 310. Progression has improved. Findings include no SRF, abnormal foveal contour, intraretinal hyper-reflective material, intraretinal fluid, lamellar hole (Persistent cystic changes temporal macula -- slight improvement, +lamellar macular hole).   Left Eye Quality was good. Central Foveal Thickness: 276.  Progression has improved. Findings include no SRF, abnormal foveal contour, intraretinal fluid (Mild interval improvement in IRF IT fovea ).   Notes *Images captured and stored on drive  Diagnosis / Impression:  OD: BRVO w/ CME - Persistent cystic changes temporal macula -- slight improvement, +lamellar macular hole OS: mild DME -- Mild interval improvement in IRF IT fovea  Clinical management:  See below  Abbreviations: NFP - Normal foveal profile. CME - cystoid macular edema. PED - pigment epithelial detachment. IRF - intraretinal fluid. SRF - subretinal fluid. EZ - ellipsoid zone. ERM - epiretinal membrane. ORA - outer retinal atrophy. ORT - outer retinal tubulation. SRHM - subretinal hyper-reflective material. IRHM - intraretinal hyper-reflective material      Intravitreal Injection, Pharmacologic Agent - OD - Right Eye       Time Out 09/11/2022. 2:29 PM. Confirmed correct patient, procedure, site, and patient consented.   Anesthesia Topical anesthesia was used. Anesthetic medications included Lidocaine 2%, Proparacaine 0.5%.   Procedure Preparation included 5% betadine to ocular surface, eyelid speculum. A (32g) needle was used.   Injection: 2 mg aflibercept 2 MG/0.05ML   Route: Intravitreal, Site: Right Eye   NDC: A3590391, Lot: [id number], Expiration date: 12/31/2023, Waste: 0 mL   Post-op Post injection exam found visual acuity of at least counting fingers. The patient tolerated the procedure well. There were no complications. The patient received written and verbal post procedure care education. Post injection medications were not given.      Intravitreal Injection, Pharmacologic Agent - OS - Left Eye       Time Out 09/11/2022. 2:29 PM. Confirmed correct patient, procedure, site, and patient consented.   Anesthesia Topical anesthesia was used. Anesthetic medications included Lidocaine 2%, Proparacaine 0.5%.   Procedure Preparation included 5% betadine to  ocular surface, eyelid speculum. A (32g) needle was used.   Injection: 2 mg aflibercept 2 MG/0.05ML   Route: Intravitreal, Site: Left Eye   NDC: A3590391, Lot: [id number], Expiration date: 04/30/2023, Waste: 0 mL   Post-op Post injection exam found visual acuity of at least counting fingers. The patient tolerated the procedure well. There were no complications. The patient received written and verbal post procedure care education. Post injection medications were not given.            ASSESSMENT/PLAN:    ICD-10-CM   1. Branch retinal vein occlusion of right eye with macular edema  H34.8310 OCT, Retina - OU - Both Eyes    Intravitreal Injection, Pharmacologic Agent - OD - Right Eye    aflibercept (EYLEA) SOLN 2 mg    2. 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 Essential hypertension  I10     4. Hypertensive retinopathy of both eyes  H35.033     5. Combined forms of age-related cataract of both eyes  H25.813      1. BRVO w/ CME OD  - s/p IVA OD #15 (02.15.23), #16 (04.27.23), #17 (05.26.23), #18 (06.23.23), #19 (07.21.23), #20 (08.15.23) -- IVA resistance  - s/p IVE OD #1 (09.15.23) - former pt of Dr. [name] -- history of IVA OD x14 from June 2018 to Feb 2020 -- then lost to retina f/u - FA 4.27.23 shows inf macula w/ vascular perfusion defects, telangectasias and mild perivascular leakage - BCVA stable at 20/40 - stable - OCT shows OD: Persistent cystic changes temporal macula -- slight improvement, +lamellar macular hole - discussed IVA resistance and possible switch in medication - recommend IVE OD #2 today, 10.13.23 - pt wishes to proceed with injection - RBA of procedure discussed, questions answered - informed consent obtained and signed for IVA OD on 02.15.23, IVE OD  9.15.23 - see  procedure note - F/U 4 weeks -- DFE/OCT/possible injection  2. Mild Non-proliferative diabetic retinopathy, both eyes  - s/p IVA OS #1 (04.27.23), #2 (05.26.23), #3 (06.23.23), #4 (07.21.23), #5 (08.15.23) -- IVA resistance  - s/p IVE OS #1 (09.15.23)  - A1c: 6.4 on 06/30/22 - FA (04.27.23) shows late leaking MA's OU, no NV OU - exam shows scattered MA/DBH OU - OCT shows OS: Mild interval improvement in IRF IT fovea  - recommend IVE OS #2 today, 10.13.23 - pt wishes to proceed with injection - RBA of procedure discussed, questions answered - informed consent obtained and signed for IVA OS 4.27.23, IVE OS 9.15.23 - see procedure note - f/u in 4 wks -- DFE/OCT, possible injection  3,4. Hypertensive retinopathy OU - discussed importance of tight BP control - continue to monitor  5. Mixed Cataract OU - The symptoms of cataract, surgical options, and treatments and risks were discussed with patient. - discussed diagnosis and progression - continue to monitor  Ophthalmic Meds Ordered this visit:  Meds ordered this encounter  Medications   aflibercept (EYLEA) SOLN 2 mg   aflibercept (EYLEA) SOLN 2 mg     Return in about 4 weeks (around 10/09/2022) for f/u BRVO OD, DFE, OCT.  There are no Patient Instructions on file for this visit.   Explained the diagnoses, plan, and follow up with the patient and they expressed understanding.  Patient expressed understanding of the importance of proper follow up care.   This document serves as a record of services personally performed by Gardiner Sleeper, MD, PhD. It was created on their behalf by Renaldo Reel, [place] an ophthalmic technician. The creation of this record is the provider's dictation and/or activities during the visit.    Electronically signed by:  Renaldo Reel, COT  09.29.23 11:31 AM   This document serves as a record of services personally performed by Gardiner Sleeper, MD, PhD. It was created on their  behalf by San Jetty. Owens Shark, OA an ophthalmic technician. The creation of this record is the provider's dictation and/or activities during the visit.    Electronically signed by: San Jetty. Owens Shark, [place] 10.13.2023 11:31 AM   Gardiner Sleeper, M.D., Ph.D. Diseases & Surgery of the Retina and Vitreous [facility]  I have reviewed the above documentation for accuracy and completeness, and I agree with the above. Gardiner Sleeper, M.D., Ph.D. 09/12/22 11:33 AM   Abbreviations: M myopia (nearsighted); A astigmatism; H hyperopia (farsighted); P presbyopia; Mrx spectacle prescription;  CTL contact lenses; OD right eye; OS left eye; OU both eyes  XT exotropia; ET esotropia; PEK punctate epithelial keratitis; PEE punctate epithelial erosions; DES dry eye syndrome; MGD meibomian gland dysfunction; ATs artificial tears; PFAT's preservative free artificial tears; [place] nuclear sclerotic cataract; PSC posterior subcapsular cataract; ERM epi-retinal membrane; PVD posterior vitreous detachment; RD retinal detachment; DM diabetes mellitus; DR diabetic retinopathy; NPDR non-proliferative diabetic retinopathy; PDR proliferative diabetic retinopathy; CSME clinically significant macular edema; DME diabetic macular edema; dbh dot blot hemorrhages; CWS cotton wool spot; POAG primary open angle glaucoma; C/D cup-to-disc ratio; HVF humphrey visual field; GVF goldmann visual field; OCT optical coherence tomography; IOP intraocular pressure; BRVO Branch retinal vein occlusion; CRVO central retinal vein occlusion; CRAO central retinal artery occlusion; BRAO branch retinal artery occlusion; RT retinal tear; SB scleral buckle; PPV pars plana vitrectomy; VH Vitreous hemorrhage; PRP panretinal laser photocoagulation; IVK intravitreal kenalog; VMT vitreomacular traction; MH Macular hole;  NVD neovascularization of the disc; NVE  neovascularization elsewhere; AREDS age related eye disease study; ARMD age related macular  degeneration; POAG primary open angle glaucoma; EBMD epithelial/anterior basement membrane dystrophy; ACIOL anterior chamber intraocular lens; IOL intraocular lens; PCIOL posterior chamber intraocular lens; Phaco/IOL phacoemulsification with intraocular lens placement; [place] photorefractive keratectomy; LASIK laser assisted in situ keratomileusis; HTN hypertension; DM diabetes mellitus; COPD chronic obstructive pulmonary disease

## 2022-09-11 ENCOUNTER — Encounter (INDEPENDENT_AMBULATORY_CARE_PROVIDER_SITE_OTHER): Payer: Self-pay | Admitting: Ophthalmology

## 2022-09-11 ENCOUNTER — Ambulatory Visit (INDEPENDENT_AMBULATORY_CARE_PROVIDER_SITE_OTHER): Payer: Medicare HMO | Admitting: Ophthalmology

## 2022-09-11 DIAGNOSIS — H35033 Hypertensive retinopathy, bilateral: Secondary | ICD-10-CM

## 2022-09-11 DIAGNOSIS — I10 Essential (primary) hypertension: Secondary | ICD-10-CM | POA: Diagnosis not present

## 2022-09-11 DIAGNOSIS — H25813 Combined forms of age-related cataract, bilateral: Secondary | ICD-10-CM

## 2022-09-11 DIAGNOSIS — E113213 Type 2 diabetes mellitus with mild nonproliferative diabetic retinopathy with macular edema, bilateral: Secondary | ICD-10-CM

## 2022-09-11 DIAGNOSIS — H34831 Tributary (branch) retinal vein occlusion, right eye, with macular edema: Secondary | ICD-10-CM | POA: Diagnosis not present

## 2022-09-11 MED ORDER — AFLIBERCEPT 2MG/0.05ML IZ SOLN FOR KALEIDOSCOPE
2.0000 mg | INTRAVITREAL | Status: AC | PRN
  Administered 2022-09-11: 2 mg via INTRAVITREAL

## 2022-10-09 ENCOUNTER — Ambulatory Visit (INDEPENDENT_AMBULATORY_CARE_PROVIDER_SITE_OTHER): Payer: Medicare HMO | Admitting: Ophthalmology

## 2022-10-09 ENCOUNTER — Encounter (INDEPENDENT_AMBULATORY_CARE_PROVIDER_SITE_OTHER): Payer: Self-pay | Admitting: Ophthalmology

## 2022-10-09 DIAGNOSIS — H34831 Tributary (branch) retinal vein occlusion, right eye, with macular edema: Secondary | ICD-10-CM

## 2022-10-09 DIAGNOSIS — H25813 Combined forms of age-related cataract, bilateral: Secondary | ICD-10-CM

## 2022-10-09 DIAGNOSIS — E113213 Type 2 diabetes mellitus with mild nonproliferative diabetic retinopathy with macular edema, bilateral: Secondary | ICD-10-CM | POA: Diagnosis not present

## 2022-10-09 DIAGNOSIS — I10 Essential (primary) hypertension: Secondary | ICD-10-CM | POA: Diagnosis not present

## 2022-10-09 DIAGNOSIS — H35033 Hypertensive retinopathy, bilateral: Secondary | ICD-10-CM | POA: Diagnosis not present

## 2022-10-09 MED ORDER — AFLIBERCEPT 2MG/0.05ML IZ SOLN FOR KALEIDOSCOPE
2.0000 mg | INTRAVITREAL | Status: AC | PRN
  Administered 2022-10-09: 2 mg via INTRAVITREAL

## 2022-10-09 NOTE — Progress Notes (Signed)
[facility] Note  10/09/2022     CHIEF COMPLAINT Patient presents for Retina Follow Up   HISTORY OF PRESENT ILLNESS: Jesus Arnold is a 81 y.o. male who presents to the clinic today for:   HPI     Retina Follow Up   Patient presents with  CRVO/BRVO.  In right eye.  This started 4 weeks ago.  I, the attending physician,  performed the HPI with the patient and updated documentation appropriately.        Comments   Patient here for 4 weeks retina follow up for BRVO OS/ NPDR OU. Patient states vision about the same. Sees great looking around. No eye pain.       Last edited by [name], MD on 10/09/2022  3:21 PM.     Pt states no change in vision, pts blood sugar was 83 this morning  Referring physician: Biagio Borg, MD [place],  [place] [id number]  HISTORICAL INFORMATION:   Selected notes from the MEDICAL RECORD NUMBER Referred by Dr. [name] for concern of BRVO LEE:  Ocular Hx- former [facility] pt, last visit 2.20.2020 -- BRVO w/ CME, history of multiple IVA (14) from June 2018 to Feb 2020. PMH-    CURRENT MEDICATIONS: No current outpatient medications on file. (Ophthalmic Drugs)   No current facility-administered medications for this visit. (Ophthalmic Drugs)   Current Outpatient Medications (Other)  Medication Sig   atorvastatin (LIPITOR) 80 MG tablet Take 1 tablet (80 mg total) by mouth daily.   blood glucose meter kit and supplies KIT Dispense based on patient and insurance preference. Use up to four times daily as directed. (E11.9).   glucose blood (ONE TOUCH ULTRA TEST) test strip Use one strip per test. Test blood sugars 1-4 times daily as instructed.   Lancets Misc. (ONE TOUCH SURESOFT) MISC Use 1 lancet per test. Test blood sugars 1-4 times per day as instructed.   metFORMIN (GLUCOPHAGE-XR) 500 MG 24 hr tablet TAKE 3 TABLETS BY MOUTH EVERY DAY WITH BREAKFAST   mometasone (ELOCON) 0.1 % cream Apply topically 2  (two) times daily.   Multiple Vitamin (MULTIVITAMIN) tablet Take 1 tablet by mouth daily.   pantoprazole (PROTONIX) 40 MG tablet Take 1 tablet (40 mg total) by mouth daily. (Patient not taking: Reported on 02/03/2022)   No current facility-administered medications for this visit. (Other)   REVIEW OF SYSTEMS: ROS   Positive for: Neurological, Endocrine, Eyes Negative for: Constitutional, Gastrointestinal, Skin, Genitourinary, Musculoskeletal, HENT, Cardiovascular, Respiratory, Psychiatric, Allergic/Imm, Heme/Lymph Last edited by Theodore Demark, COA on 10/09/2022  2:06 PM.     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 No pertinent surgical history.  FAMILY HISTORY History reviewed. No pertinent family history.  SOCIAL HISTORY Social History   Tobacco Use   Smoking status: Former    Types: Cigarettes    Quit date: 01/28/2017    Years since quitting: 5.6   Smokeless tobacco: Never  Vaping Use   Vaping Use: Never used  Substance Use Topics   Alcohol use: No   Drug use: No       OPHTHALMIC EXAM:  Base Eye Exam     Visual Acuity (Snellen - Linear)       Right Left  Dist cc 20/60 -1 20/30 -1   Dist ph cc 20/50 -2 20/20 -2    Correction: Glasses         Tonometry (Tonopen, 2:04 PM)       Right Left   Pressure 17 17         Pupils       Dark Light Shape React APD   Right 3 2 Round Minimal None   Left 3 2 Round Minimal None         Visual Fields (Counting fingers)       Left Right    Full Full         Extraocular Movement       Right Left    Full, Ortho Full, Ortho         Neuro/Psych     Oriented x3: Yes   Mood/Affect: Normal         Dilation     Both eyes: 1.0% Mydriacyl, 2.5% Phenylephrine @ 2:04 PM           Slit Lamp  and Fundus Exam     Slit Lamp Exam       Right Left   Lids/Lashes Dermatochalasis - upper lid, Meibomian gland dysfunction Dermatochalasis - upper lid, Meibomian gland dysfunction   Conjunctiva/Sclera Melanosis Melanosis   Cornea arcus, trace PEE arcus, trace PEE   Anterior Chamber Deep and quiet Deep and quiet   Iris Round and dilated Round and dilated   Lens 2-3+ Nuclear sclerosis, 2-3+ Cortical cataract 2-3+ Nuclear sclerosis, 2-3+ Cortical cataract   Anterior Vitreous Vitreous syneresis, silicone micro bubbles, Posterior vitreous detachment, vitreous condensations Vitreous syneresis, silicone micro bubbles, Posterior vitreous detachment, vitreous condensations         Fundus Exam       Right Left   Disc Pink and Sharp Pink and Sharp, +cupping, mild PPA   C/D Ratio 0.6 0.7   Macula Flat, good foveal reflex, scattered MA/DBH and exudates temporal macula -- slightly improved, +edema IT macula Flat, Good foveal reflex, scattered MA greatest perifovea -- improved, mild ERM, persistent cystic changes   Vessels attenuated, tortuous, early sclerosis distal IT arcades attenuated, Tortuous   Periphery Attached, rare MA/DBH Attached, rare MA/DBH           Refraction     Wearing Rx       Sphere Cylinder Add   Right +1.75 Sphere +2.75   Left +1.75 Sphere +2.75           IMAGING AND PROCEDURES  Imaging and Procedures for 10/09/2022  OCT, Retina - OU - Both Eyes       Right Eye Quality was good. Central Foveal Thickness: 310. Progression has been stable. Findings include no SRF, abnormal foveal contour, intraretinal hyper-reflective material, intraretinal fluid, lamellar hole (Persistent cystic changes temporal macula, +lamellar macular hole).   Left Eye Quality was good. Central Foveal Thickness: 276. Progression has been stable. Findings include no SRF, abnormal foveal contour, intraretinal fluid (persistent IRF IT fovea ).   Notes *Images captured and stored on  drive  Diagnosis / Impression:  OD: BRVO w/ CME - Persistent cystic changes temporal macula, +lamellar macular hole OS: mild DME -- persistent  IRF IT fovea   Clinical management:  See below  Abbreviations: NFP - Normal foveal profile. CME - cystoid macular edema. PED - pigment epithelial detachment. IRF - intraretinal fluid. SRF - subretinal fluid. EZ - ellipsoid zone. ERM - epiretinal membrane. ORA - outer retinal  atrophy. ORT - outer retinal tubulation. SRHM - subretinal hyper-reflective material. IRHM - intraretinal hyper-reflective material      Intravitreal Injection, Pharmacologic Agent - OD - Right Eye       Time Out 10/09/2022. 3:00 PM. Confirmed correct patient, procedure, site, and patient consented.   Anesthesia Topical anesthesia was used. Anesthetic medications included Lidocaine 2%, Proparacaine 0.5%.   Procedure Preparation included 5% betadine to ocular surface, eyelid speculum. A (32g) needle was used.   Injection: 2 mg aflibercept 2 MG/0.05ML   Route: Intravitreal, Site: Right Eye   NDC: A3590391, Lot: [id number], Expiration date: 03/31/2023, Waste: 0 mL   Post-op Post injection exam found visual acuity of at least counting fingers. The patient tolerated the procedure well. There were no complications. The patient received written and verbal post procedure care education. Post injection medications were not given.      Intravitreal Injection, Pharmacologic Agent - OS - Left Eye       Time Out 10/09/2022. 3:01 PM. Confirmed correct patient, procedure, site, and patient consented.   Anesthesia Topical anesthesia was used. Anesthetic medications included Lidocaine 2%, Proparacaine 0.5%.   Procedure Preparation included 5% betadine to ocular surface, eyelid speculum. A (32g) needle was used.   Injection: 2 mg aflibercept 2 MG/0.05ML   Route: Intravitreal, Site: Left Eye   NDC: A3590391, Lot: [id number], Expiration date: 12/01/2023, Waste: 0 mL    Post-op Post injection exam found visual acuity of at least counting fingers. The patient tolerated the procedure well. There were no complications. The patient received written and verbal post procedure care education. Post injection medications were not given.            ASSESSMENT/PLAN:    ICD-10-CM   1. Branch retinal vein occlusion of right eye with macular edema  H34.8310 OCT, Retina - OU - Both Eyes    Intravitreal Injection, Pharmacologic Agent - OD - Right Eye    Intravitreal Injection, Pharmacologic Agent - OS - Left Eye    aflibercept (EYLEA) SOLN 2 mg    aflibercept (EYLEA) SOLN 2 mg    2. 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 Essential hypertension  I10     4. Hypertensive retinopathy of both eyes  H35.033     5. Combined forms of age-related cataract of both eyes  H25.813      1. BRVO w/ CME OD  - s/p IVA OD #15 (02.15.23), #16 (04.27.23), #17 (05.26.23), #18 (06.23.23), #19 (07.21.23), #20 (08.15.23) -- IVA resistance  - s/p IVE OD #1 (09.15.23), #2 (10.13.23) - former pt of Dr. [name] -- history of IVA OD x14 from June 2018 to Feb 2020 -- then lost to retina f/u - FA 4.27.23 shows inf macula w/ vascular perfusion defects, telangectasias and mild perivascular leakage - BCVA stable at 20/50 (slightly worse from 20/30) - OCT shows OD: Persistent cystic changes temporal macula, +lamellar macular hole - recommend IVE OD #3 today, 11.10.23 - pt wishes to proceed with injection - RBA of procedure discussed, questions answered - informed consent obtained and signed for IVA OD on 02.15.23, IVE OD 9.15.23 - see procedure note - F/U 4 weeks -- DFE/OCT/possible injection  2. Mild Non-proliferative diabetic retinopathy, both eyes  - s/p IVA OS #1 (04.27.23), #2 (05.26.23), #3 (06.23.23), #4 (07.21.23), #5 (08.15.23) -- IVA resistance  - s/p IVE OS #1  (09.15.23), #2 (10.13.23)  -  A1c: 6.4 on 06/30/22 - FA (04.27.23) shows late leaking MA's OU, no NV OU - exam shows scattered MA/DBH OU - BCVA OD 20/20-2 - OCT shows OS: persistent IRF IT fovea  - recommend IVE OS #3 today, 11.10.23 - pt wishes to proceed with injection - RBA of procedure discussed, questions answered - informed consent obtained and signed for IVA OS 4.27.23, IVE OS 9.15.23 - see procedure note - f/u in 4 wks -- DFE/OCT, possible injection  3,4. Hypertensive retinopathy OU - discussed importance of tight BP control - continue to monitor  5. Mixed Cataract OU - The symptoms of cataract, surgical options, and treatments and risks were discussed with patient. - discussed diagnosis and progression - continue to monitor  Ophthalmic Meds Ordered this visit:  Meds ordered this encounter  Medications   aflibercept (EYLEA) SOLN 2 mg   aflibercept (EYLEA) SOLN 2 mg     Return in about 4 weeks (around 11/06/2022) for f/u BRVO OD, DFE, OCT.  There are no Patient Instructions on file for this visit.   Explained the diagnoses, plan, and follow up with the patient and they expressed understanding.  Patient expressed understanding of the importance of proper follow up care.   This document serves as a record of services personally performed by Gardiner Sleeper, MD, PhD. It was created on their behalf by San Jetty. Owens Shark, OA an ophthalmic technician. The creation of this record is the provider's dictation and/or activities during the visit.    Electronically signed by: San Jetty. Owens Shark, [place] 11.10.2023 3:30 PM  Gardiner Sleeper, M.D., Ph.D. Diseases & Surgery of the Retina and Vitreous [facility]  I have reviewed the above documentation for accuracy and completeness, and I agree with the above. Gardiner Sleeper, M.D., Ph.D. 10/09/22 3:30 PM  Abbreviations: M myopia (nearsighted); A astigmatism; H hyperopia (farsighted); P presbyopia; Mrx spectacle  prescription;  CTL contact lenses; OD right eye; OS left eye; OU both eyes  XT exotropia; ET esotropia; PEK punctate epithelial keratitis; PEE punctate epithelial erosions; DES dry eye syndrome; MGD meibomian gland dysfunction; ATs artificial tears; PFAT's preservative free artificial tears; [place] nuclear sclerotic cataract; PSC posterior subcapsular cataract; ERM epi-retinal membrane; PVD posterior vitreous detachment; RD retinal detachment; DM diabetes mellitus; DR diabetic retinopathy; NPDR non-proliferative diabetic retinopathy; PDR proliferative diabetic retinopathy; CSME clinically significant macular edema; DME diabetic macular edema; dbh dot blot hemorrhages; CWS cotton wool spot; POAG primary open angle glaucoma; C/D cup-to-disc ratio; HVF humphrey visual field; GVF goldmann visual field; OCT optical coherence tomography; IOP intraocular pressure; BRVO Branch retinal vein occlusion; CRVO central retinal vein occlusion; CRAO central retinal artery occlusion; BRAO branch retinal artery occlusion; RT retinal tear; SB scleral buckle; PPV pars plana vitrectomy; VH Vitreous hemorrhage; PRP panretinal laser photocoagulation; IVK intravitreal kenalog; VMT vitreomacular traction; MH Macular hole;  NVD neovascularization of the disc; NVE neovascularization elsewhere; AREDS age related eye disease study; ARMD age related macular degeneration; POAG primary open angle glaucoma; EBMD epithelial/anterior basement membrane dystrophy; ACIOL anterior chamber intraocular lens; IOL intraocular lens; PCIOL posterior chamber intraocular lens; Phaco/IOL phacoemulsification with intraocular lens placement; [place] photorefractive keratectomy; LASIK laser assisted in situ keratomileusis; HTN hypertension; DM diabetes mellitus; COPD chronic obstructive pulmonary disease

## 2022-11-04 NOTE — Progress Notes (Signed)
[facility] Note  11/06/2022     CHIEF COMPLAINT Patient presents for Retina Follow Up   HISTORY OF PRESENT ILLNESS: Jesus Arnold is a 81 y.o. male who presents to the clinic today for:   HPI     Retina Follow Up   Patient presents with  CRVO/BRVO.  In right eye.  This started 4 weeks ago.  Duration of 4.  I, the attending physician,  performed the HPI with the patient and updated documentation appropriately.        Comments   4 week retina follow up BRVO OD and I'VE OD pt states no vision changes noticed his last blood sugar reading 83 this am       Last edited by [name], MD on 11/06/2022  4:30 PM.    Patient states no change in vision.   Referring physician: Biagio Borg, MD [place],  [place] [id number]  HISTORICAL INFORMATION:   Selected notes from the MEDICAL RECORD NUMBER Referred by Dr. [name] for concern of BRVO LEE:  Ocular Hx- former [facility] pt, last visit 2.20.2020 -- BRVO w/ CME, history of multiple IVA (14) from June 2018 to Feb 2020. PMH-    CURRENT MEDICATIONS: No current outpatient medications on file. (Ophthalmic Drugs)   No current facility-administered medications for this visit. (Ophthalmic Drugs)   Current Outpatient Medications (Other)  Medication Sig   atorvastatin (LIPITOR) 80 MG tablet Take 1 tablet (80 mg total) by mouth daily.   blood glucose meter kit and supplies KIT Dispense based on patient and insurance preference. Use up to four times daily as directed. (E11.9).   glucose blood (ONE TOUCH ULTRA TEST) test strip Use one strip per test. Test blood sugars 1-4 times daily as instructed.   Lancets Misc. (ONE TOUCH SURESOFT) MISC Use 1 lancet per test. Test blood sugars 1-4 times per day as instructed.   metFORMIN (GLUCOPHAGE-XR) 500 MG 24 hr tablet TAKE 3 TABLETS BY MOUTH EVERY DAY WITH BREAKFAST   mometasone (ELOCON) 0.1 % cream Apply topically 2 (two) times daily.   Multiple Vitamin  (MULTIVITAMIN) tablet Take 1 tablet by mouth daily.   pantoprazole (PROTONIX) 40 MG tablet Take 1 tablet (40 mg total) by mouth daily. (Patient not taking: Reported on 02/03/2022)   No current facility-administered medications for this visit. (Other)   REVIEW OF SYSTEMS: ROS   Positive for: Neurological, Endocrine, Eyes Negative for: Constitutional, Gastrointestinal, Skin, Genitourinary, Musculoskeletal, HENT, Cardiovascular, Respiratory, Psychiatric, Allergic/Imm, Heme/Lymph Last edited by Roselee Nova D, COT on 11/06/2022  3:08 PM.     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 No pertinent surgical history.  FAMILY HISTORY History reviewed. No pertinent family history.  SOCIAL HISTORY Social History   Tobacco Use   Smoking status: Former    Types: Cigarettes    Quit date: 01/28/2017    Years since quitting: 5.7   Smokeless tobacco: Never  Vaping Use   Vaping Use: Never used  Substance Use Topics   Alcohol use: No   Drug use: No       OPHTHALMIC EXAM:  Base Eye Exam     Visual Acuity (Snellen - Linear)       Right Left   Dist cc 20/50 +1  20/40 -3   Dist ph cc 20/40 -2 20/30 -3    Correction: Glasses         Tonometry (Tonopen, 1:50 PM)       Right Left   Pressure 20 16         Pupils       Pupils Dark Light Shape React APD   Right PERRL 3 2 Round Minimal None   Left PERRL 3 2 Round Minimal None         Visual Fields       Left Right    Full Full         Extraocular Movement       Right Left    Full, Ortho Full, Ortho         Neuro/Psych     Oriented x3: Yes   Mood/Affect: Normal         Dilation     Both eyes: 2.5% Phenylephrine @ 1:50 PM           Slit Lamp and Fundus Exam     Slit Lamp Exam       Right Left    Lids/Lashes Dermatochalasis - upper lid, Meibomian gland dysfunction Dermatochalasis - upper lid, Meibomian gland dysfunction   Conjunctiva/Sclera Melanosis Melanosis   Cornea arcus, trace PEE arcus, trace PEE   Anterior Chamber Deep and quiet Deep and quiet   Iris Round and dilated Round and dilated   Lens 2-3+ Nuclear sclerosis, 2-3+ Cortical cataract 2-3+ Nuclear sclerosis, 2-3+ Cortical cataract   Anterior Vitreous Vitreous syneresis, silicone micro bubbles, Posterior vitreous detachment, vitreous condensations Vitreous syneresis, silicone micro bubbles, Posterior vitreous detachment, vitreous condensations         Fundus Exam       Right Left   Disc Pink and Sharp Pink and Sharp, +cupping, mild PPA   C/D Ratio 0.6 0.7   Macula Flat, good foveal reflex, scattered MA/DBH and exudates temporal macula -- slightly improved, +edema IT macula--improved Flat, Good foveal reflex, scattered MA greatest perifovea -- improved, mild ERM, persistent cystic changes--slightly improved   Vessels attenuated, tortuous, early sclerosis distal IT arcades attenuated, Tortuous   Periphery Attached, rare MA/DBH Attached, rare MA/DBH           Refraction     Wearing Rx       Sphere Cylinder Add   Right +1.75 Sphere +2.75   Left +1.75 Sphere +2.75           IMAGING AND PROCEDURES  Imaging and Procedures for 11/06/2022  OCT, Retina - OU - Both Eyes       Right Eye Quality was good. Central Foveal Thickness: 309. Progression has improved. Findings include no SRF, abnormal foveal contour, intraretinal hyper-reflective material, intraretinal fluid, lamellar hole (Persistent cystic changes temporal macula--slightly improved, +lamellar macular hole).   Left Eye Quality was good. Central Foveal Thickness: 273. Progression has improved. Findings include no SRF, abnormal foveal contour, intraretinal fluid (persistent IRF IT fovea, mild interval improvement in cystic changes nasal fovea).    Notes *Images captured and stored on drive  Diagnosis / Impression:  OD: BRVO w/ CME - persistent cystic changes temporal macula slightly improved, +lamellar macular hole OS: mild DME -- persistent IRF IT fovea, mild interval improvement in cystic changes nasal fovea  Clinical management:  See below  Abbreviations: NFP - Normal foveal profile. CME - cystoid macular edema. PED - pigment epithelial detachment. IRF - intraretinal fluid. SRF - subretinal fluid. EZ -  ellipsoid zone. ERM - epiretinal membrane. ORA - outer retinal atrophy. ORT - outer retinal tubulation. SRHM - subretinal hyper-reflective material. IRHM - intraretinal hyper-reflective material      Intravitreal Injection, Pharmacologic Agent - OD - Right Eye       Time Out 11/06/2022. 2:51 PM. Confirmed correct patient, procedure, site, and patient consented.   Anesthesia Topical anesthesia was used. Anesthetic medications included Lidocaine 2%, Proparacaine 0.5%.   Procedure Preparation included 5% betadine to ocular surface, eyelid speculum.   Injection: 2 mg aflibercept 2 MG/0.05ML   Route: Intravitreal, Site: Right Eye   NDC: A3590391, Lot: [id number], Expiration date: 01/28/2024, Waste: 0 mL   Post-op Post injection exam found visual acuity of at least counting fingers. The patient tolerated the procedure well. There were no complications. The patient received written and verbal post procedure care education. Post injection medications were not given.      Intravitreal Injection, Pharmacologic Agent - OS - Left Eye       Time Out 11/06/2022. 2:52 PM. Confirmed correct patient, procedure, site, and patient consented.   Anesthesia Topical anesthesia was used. Anesthetic medications included Lidocaine 2%, Proparacaine 0.5%.   Procedure Preparation included 5% betadine to ocular surface, eyelid speculum.   Injection: 2 mg aflibercept 2 MG/0.05ML   Route: Intravitreal, Site: Left Eye   NDC:  A3590391, Lot: [id number], Expiration date: 01/28/2024, Waste: 0 mL   Post-op Post injection exam found visual acuity of at least counting fingers. The patient tolerated the procedure well. There were no complications. The patient received written and verbal post procedure care education. Post injection medications were not given.            ASSESSMENT/PLAN:    ICD-10-CM   1. Branch retinal vein occlusion of right eye with macular edema  H34.8310 OCT, Retina - OU - Both Eyes    Intravitreal Injection, Pharmacologic Agent - OD - Right Eye    aflibercept (EYLEA) SOLN 2 mg    2. 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 Essential hypertension  I10     4. Hypertensive retinopathy of both eyes  H35.033     5. Combined forms of age-related cataract of both eyes  H25.813      1. BRVO w/ CME OD  - s/p IVA OD #15 (02.15.23), #16 (04.27.23), #17 (05.26.23), #18 (06.23.23), #19 (07.21.23), #20 (08.15.23) -- IVA resistance  - s/p IVE OD #1 (09.15.23), #2 (10.13.23),  #3 (11.10.23) - former pt of Dr. [name] -- history of IVA OD x14 from June 2018 to Feb 2020 -- then lost to retina f/u - FA 4.27.23 shows inf macula w/ vascular perfusion defects, telangectasias and mild perivascular leakage - BCVA stable at 20/40-2 (slightly improved from 20/50-2) - OCT shows OD: persistent cystic changes temporal macula slightly improved, +lamellar macular hole - recommend IVE OD #4 today, 12.07.23 - pt wishes to proceed with injection - RBA of procedure discussed, questions answered - informed consent obtained and signed for IVA OD on 02.15.23, IVE OD 9.15.23 - see procedure note - F/U 4 weeks -- DFE/OCT/possible injection  2. Mild Non-proliferative diabetic retinopathy,  both eyes  - s/p IVA OS #1 (04.27.23), #2 (05.26.23), #3 (06.23.23), #4 (07.21.23), #5 (08.15.23) -- IVA resistance  -  s/p IVE OS #1 (09.15.23), #2 (10.13.23), #3 (11.10.23)  - A1c: 6.4 on 06/30/22 - FA (04.27.23) shows late leaking MA's OU, no NV OU - exam shows scattered MA/DBH OU - BCVA OD 20/30-3 (slightly down from 20/20-2) - OCT shows OS: persistent IRF IT fovea, mild interval improvement in cystic changes nasal fovea - recommend IVE OS #4 today, 12.07.23 - pt wishes to proceed with injection - RBA of procedure discussed, questions answered - informed consent obtained and signed for IVA OS 4.27.23, IVE OS 9.15.23 - see procedure note - f/u in 4 wks -- DFE/OCT, possible injection  3,4. Hypertensive retinopathy OU - discussed importance of tight BP control - continue to monitor  5. Mixed Cataract OU - The symptoms of cataract, surgical options, and treatments and risks were discussed with patient. - discussed diagnosis and progression - continue to monitor  Ophthalmic Meds Ordered this visit:  Meds ordered this encounter  Medications   aflibercept (EYLEA) SOLN 2 mg   aflibercept (EYLEA) SOLN 2 mg     Return in about 4 weeks (around 12/04/2022) for BRVO OD, DME OS -- DFE, OCT, Possible Injxn.  There are no Patient Instructions on file for this visit.   Explained the diagnoses, plan, and follow up with the patient and they expressed understanding.  Patient expressed understanding of the importance of proper follow up care.   This document serves as a record of services personally performed by Gardiner Sleeper, MD, PhD. It was created on their behalf by San Jetty. Owens Shark, OA an ophthalmic technician. The creation of this record is the provider's dictation and/or activities during the visit.    Electronically signed by: San Jetty. Owens Shark, [place] 12.06.2023 1:37 AM  This document serves as a record of services personally performed by Gardiner Sleeper, MD, PhD. It was created on their  behalf by Roselee Nova, COMT. The creation of this record is the provider's dictation and/or activities during the visit.  Electronically signed by: Roselee Nova, COMT 11/09/22 1:37 AM  Gardiner Sleeper, M.D., Ph.D. Diseases & Surgery of the Retina and Vitreous [facility]  I have reviewed the above documentation for accuracy and completeness, and I agree with the above. Gardiner Sleeper, M.D., Ph.D. 11/09/22 1:38 AM   Abbreviations: M myopia (nearsighted); A astigmatism; H hyperopia (farsighted); P presbyopia; Mrx spectacle prescription;  CTL contact lenses; OD right eye; OS left eye; OU both eyes  XT exotropia; ET esotropia; PEK punctate epithelial keratitis; PEE punctate epithelial erosions; DES dry eye syndrome; MGD meibomian gland dysfunction; ATs artificial tears; PFAT's preservative free artificial tears; [place] nuclear sclerotic cataract; PSC posterior subcapsular cataract; ERM epi-retinal membrane; PVD posterior vitreous detachment; RD retinal detachment; DM diabetes mellitus; DR diabetic retinopathy; NPDR non-proliferative diabetic retinopathy; PDR proliferative diabetic retinopathy; CSME clinically significant macular edema; DME diabetic macular edema; dbh dot blot hemorrhages; CWS cotton wool spot; POAG primary open angle glaucoma; C/D cup-to-disc ratio; HVF humphrey visual field; GVF goldmann visual field; OCT optical coherence tomography; IOP intraocular pressure; BRVO Branch retinal vein occlusion; CRVO central retinal vein occlusion; CRAO central retinal artery occlusion; BRAO branch retinal artery occlusion; RT retinal tear; SB scleral buckle; PPV pars plana vitrectomy; VH Vitreous hemorrhage; PRP panretinal laser photocoagulation; IVK intravitreal kenalog; VMT vitreomacular traction; MH Macular hole;  NVD neovascularization of the disc; NVE neovascularization elsewhere; AREDS age related eye disease study; ARMD age related macular degeneration; POAG primary open angle  glaucoma; EBMD epithelial/anterior basement membrane dystrophy; ACIOL anterior  chamber intraocular lens; IOL intraocular lens; PCIOL posterior chamber intraocular lens; Phaco/IOL phacoemulsification with intraocular lens placement; [place] photorefractive keratectomy; LASIK laser assisted in situ keratomileusis; HTN hypertension; DM diabetes mellitus; COPD chronic obstructive pulmonary disease

## 2022-11-05 DIAGNOSIS — L400 Psoriasis vulgaris: Secondary | ICD-10-CM | POA: Diagnosis not present

## 2022-11-06 ENCOUNTER — Ambulatory Visit (INDEPENDENT_AMBULATORY_CARE_PROVIDER_SITE_OTHER): Payer: Medicare HMO | Admitting: Ophthalmology

## 2022-11-06 ENCOUNTER — Encounter (INDEPENDENT_AMBULATORY_CARE_PROVIDER_SITE_OTHER): Payer: Self-pay | Admitting: Ophthalmology

## 2022-11-06 DIAGNOSIS — E113213 Type 2 diabetes mellitus with mild nonproliferative diabetic retinopathy with macular edema, bilateral: Secondary | ICD-10-CM | POA: Diagnosis not present

## 2022-11-06 DIAGNOSIS — H25813 Combined forms of age-related cataract, bilateral: Secondary | ICD-10-CM | POA: Diagnosis not present

## 2022-11-06 DIAGNOSIS — H35033 Hypertensive retinopathy, bilateral: Secondary | ICD-10-CM | POA: Diagnosis not present

## 2022-11-06 DIAGNOSIS — H34831 Tributary (branch) retinal vein occlusion, right eye, with macular edema: Secondary | ICD-10-CM

## 2022-11-06 DIAGNOSIS — I10 Essential (primary) hypertension: Secondary | ICD-10-CM | POA: Diagnosis not present

## 2022-11-06 MED ORDER — AFLIBERCEPT 2MG/0.05ML IZ SOLN FOR KALEIDOSCOPE
2.0000 mg | INTRAVITREAL | Status: AC | PRN
  Administered 2022-11-06: 2 mg via INTRAVITREAL

## 2022-11-10 DIAGNOSIS — E785 Hyperlipidemia, unspecified: Secondary | ICD-10-CM | POA: Diagnosis not present

## 2022-11-10 DIAGNOSIS — E1122 Type 2 diabetes mellitus with diabetic chronic kidney disease: Secondary | ICD-10-CM | POA: Diagnosis not present

## 2022-11-10 DIAGNOSIS — G629 Polyneuropathy, unspecified: Secondary | ICD-10-CM | POA: Diagnosis not present

## 2022-11-10 DIAGNOSIS — N1832 Chronic kidney disease, stage 3b: Secondary | ICD-10-CM | POA: Diagnosis not present

## 2022-11-11 DIAGNOSIS — N1832 Chronic kidney disease, stage 3b: Secondary | ICD-10-CM | POA: Diagnosis not present

## 2022-11-16 ENCOUNTER — Other Ambulatory Visit: Payer: Self-pay | Admitting: Internal Medicine

## 2022-11-16 DIAGNOSIS — N1832 Chronic kidney disease, stage 3b: Secondary | ICD-10-CM

## 2022-11-25 ENCOUNTER — Ambulatory Visit
Admission: RE | Admit: 2022-11-25 | Discharge: 2022-11-25 | Disposition: A | Discharge status: Home / Self Care | Payer: Medicare HMO | Source: Ambulatory Visit | Attending: Internal Medicine | Admitting: Internal Medicine

## 2022-11-25 DIAGNOSIS — N2889 Other specified disorders of kidney and ureter: Secondary | ICD-10-CM | POA: Diagnosis not present

## 2022-11-25 DIAGNOSIS — N1832 Chronic kidney disease, stage 3b: Secondary | ICD-10-CM

## 2022-11-27 ENCOUNTER — Other Ambulatory Visit: Payer: Self-pay | Admitting: Internal Medicine

## 2022-11-27 DIAGNOSIS — N2889 Other specified disorders of kidney and ureter: Secondary | ICD-10-CM

## 2022-12-03 NOTE — Progress Notes (Signed)
[facility] Note  12/07/2022     CHIEF COMPLAINT Patient presents for Retina Follow Up   HISTORY OF PRESENT ILLNESS: Jesus Arnold is a 82 y.o. male who presents to the clinic today for:   HPI     Retina Follow Up   Patient presents with  CRVO/BRVO.  In right eye.  This started 4 weeks ago.  I, the attending physician,  performed the HPI with the patient and updated documentation appropriately.        Comments   Patient here for 4 weeks retina follow up for BRVO OD. Patient states vision is ok. On occasion has little bit eye pain OD. Not using drops.       Last edited by [name], MD on 12/07/2022  3:57 PM.      Referring physician: Biagio Borg, MD [place],  [place] [address]  HISTORICAL INFORMATION:   Selected notes from the MEDICAL RECORD NUMBER Referred by Dr. [name] for concern of BRVO LEE:  Ocular Hx- former [facility] pt, last visit 2.20.2020 -- BRVO w/ CME, history of multiple IVA (14) from June 2018 to Feb 2020. PMH-    CURRENT MEDICATIONS: No current outpatient medications on file. (Ophthalmic Drugs)   No current facility-administered medications for this visit. (Ophthalmic Drugs)   Current Outpatient Medications (Other)  Medication Sig   atorvastatin (LIPITOR) 80 MG tablet Take 1 tablet (80 mg total) by mouth daily.   blood glucose meter kit and supplies KIT Dispense based on patient and insurance preference. Use up to four times daily as directed. (E11.9).   glucose blood (ONE TOUCH ULTRA TEST) test strip Use one strip per test. Test blood sugars 1-4 times daily as instructed.   Lancets Misc. (ONE TOUCH SURESOFT) MISC Use 1 lancet per test. Test blood sugars 1-4 times per day as instructed.   metFORMIN (GLUCOPHAGE-XR) 500 MG 24 hr tablet TAKE 3 TABLETS BY MOUTH EVERY DAY WITH BREAKFAST   mometasone (ELOCON) 0.1 % cream Apply topically 2 (two) times daily.   Multiple Vitamin (MULTIVITAMIN) tablet Take 1 tablet  by mouth daily.   pantoprazole (PROTONIX) 40 MG tablet Take 1 tablet (40 mg total) by mouth daily. (Patient not taking: Reported on 02/03/2022)   No current facility-administered medications for this visit. (Other)   REVIEW OF SYSTEMS: ROS   Positive for: Neurological, Endocrine, Eyes Negative for: Constitutional, Gastrointestinal, Skin, Genitourinary, Musculoskeletal, HENT, Cardiovascular, Respiratory, Psychiatric, Allergic/Imm, Heme/Lymph Last edited by Theodore Demark, COA on 12/07/2022  2:43 PM.     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 No pertinent surgical history.  FAMILY HISTORY History reviewed. No pertinent family history.  SOCIAL HISTORY Social History   Tobacco Use   Smoking status: Former    Types: Cigarettes    Quit date: 01/28/2017    Years since quitting: 5.8   Smokeless tobacco: Never  Vaping Use   Vaping Use: Never used  Substance Use Topics   Alcohol use: No   Drug use: No       OPHTHALMIC EXAM:  Base Eye Exam     Visual Acuity (Snellen - Linear)       Right Left   Dist cc 20/50 -2 20/40 -2   Dist ph cc  20/40 -2 20/25 -1    Correction: Glasses         Tonometry (Tonopen, 2:41 PM)       Right Left   Pressure 08 08         Pupils       Dark Light Shape React APD   Right 3 2 Round Brisk None   Left 3 2 Round Brisk None         Visual Fields (Counting fingers)       Left Right    Full Full         Extraocular Movement       Right Left    Full, Ortho Full, Ortho         Neuro/Psych     Oriented x3: Yes   Mood/Affect: Normal         Dilation     Both eyes: 1.0% Mydriacyl, 2.5% Phenylephrine @ 2:40 PM           Slit Lamp and Fundus Exam     Slit Lamp Exam       Right Left   Lids/Lashes  Dermatochalasis - upper lid, Meibomian gland dysfunction Dermatochalasis - upper lid, Meibomian gland dysfunction   Conjunctiva/Sclera Melanosis Melanosis   Cornea arcus, trace PEE arcus, trace PEE   Anterior Chamber Deep and quiet Deep and quiet   Iris Round and dilated Round and dilated   Lens 2-3+ Nuclear sclerosis, 2-3+ Cortical cataract 2-3+ Nuclear sclerosis, 2-3+ Cortical cataract   Anterior Vitreous Vitreous syneresis, silicone micro bubbles, Posterior vitreous detachment, vitreous condensations Vitreous syneresis, silicone micro bubbles, Posterior vitreous detachment, vitreous condensations         Fundus Exam       Right Left   Disc Pink and Sharp Pink and Sharp, +cupping, mild PPA   C/D Ratio 0.6 0.7   Macula Flat, good foveal reflex, scattered MA/DBH and exudates temporal macula -- slightly improved, +edema IT macula -- improved Flat, Good foveal reflex, scattered MA greatest perifovea -- improved, mild ERM, persistent cystic changes -- slightly improved   Vessels attenuated, tortuous, sclerosis distal IT arcades mild attenuation, mild tortuosity   Periphery Attached, rare MA/DBH Attached, rare MA/DBH           Refraction     Wearing Rx       Sphere Cylinder Add   Right +1.75 Sphere +2.75   Left +1.75 Sphere +2.75           IMAGING AND PROCEDURES  Imaging and Procedures for 12/07/2022  OCT, Retina - OU - Both Eyes       Right Eye Quality was good. Central Foveal Thickness: 311. Progression has improved. Findings include no SRF, abnormal foveal contour, intraretinal hyper-reflective material, intraretinal fluid, lamellar hole (Persistent cystic changes temporal macula -- slightly improved, +lamellar macular hole).   Left Eye Quality was good. Central Foveal Thickness: 272. Progression has been stable. Findings include no SRF, abnormal foveal contour, intraretinal fluid (persistent IRF IT fovea).   Notes *Images captured and stored on drive  Diagnosis /  Impression:  OD: BRVO w/ CME - persistent cystic changes temporal macula slightly improved, +lamellar macular hole OS: mild DME -- persistent IRF IT fovea  Clinical management:  See below  Abbreviations: NFP - Normal foveal profile. CME - cystoid macular edema. PED - pigment epithelial detachment. IRF - intraretinal fluid. SRF - subretinal fluid. EZ - ellipsoid zone. ERM - epiretinal membrane. ORA - outer retinal atrophy. ORT - outer  retinal tubulation. SRHM - subretinal hyper-reflective material. IRHM - intraretinal hyper-reflective material      Intravitreal Injection, Pharmacologic Agent - OD - Right Eye       Time Out 12/07/2022. 3:28 PM. Confirmed correct patient, procedure, site, and patient consented.   Anesthesia Topical anesthesia was used. Anesthetic medications included Lidocaine 2%, Proparacaine 0.5%.   Procedure Preparation included 5% betadine to ocular surface, eyelid speculum. A (32g) needle was used.   Injection: 2 mg aflibercept 2 MG/0.05ML   Route: Intravitreal, Site: Right Eye   NDC: [id number], Lot: [id number], Expiration date: 02/27/2023, Waste: 0 mL   Post-op Post injection exam found visual acuity of at least counting fingers. The patient tolerated the procedure well. There were no complications. The patient received written and verbal post procedure care education. Post injection medications were not given.      Intravitreal Injection, Pharmacologic Agent - OS - Left Eye       Time Out 12/07/2022. 3:30 PM. Confirmed correct patient, procedure, site, and patient consented.   Anesthesia Topical anesthesia was used. Anesthetic medications included Lidocaine 2%, Proparacaine 0.5%.   Procedure Preparation included 5% betadine to ocular surface, eyelid speculum. A (32g) needle was used.   Injection: 2 mg aflibercept 2 MG/0.05ML   Route: Intravitreal, Site: Left Eye   NDC: M7179715, Lot: [id number], Expiration date: 03/30/2023, Waste: 0 mL    Post-op Post injection exam found visual acuity of at least counting fingers. The patient tolerated the procedure well. There were no complications. The patient received written and verbal post procedure care education. Post injection medications were not given.            ASSESSMENT/PLAN:    ICD-10-CM   1. Branch retinal vein occlusion of right eye with macular edema  H34.8310 OCT, Retina - OU - Both Eyes    Intravitreal Injection, Pharmacologic Agent - OD - Right Eye    aflibercept (EYLEA) SOLN 2 mg    2. 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 Essential hypertension  I10     4. Hypertensive retinopathy of both eyes  H35.033     5. Combined forms of age-related cataract of both eyes  H25.813     6. Moderate nonproliferative diabetic retinopathy of both eyes with macular edema associated with type 2 diabetes mellitus ([place])  E31.5400      1. BRVO w/ CME OD  - s/p IVA OD #15 (02.15.23), #16 (04.27.23), #17 (05.26.23), #18 (06.23.23), #19 (07.21.23), #20 (08.15.23) -- IVA resistance  - s/p IVE OD #1 (09.15.23), #2 (10.13.23),  #3 (11.10.23),  #4 (12.07.23) - former pt of Dr. [name] -- history of IVA OD x14 from June 2018 to Feb 2020 -- then lost to retina f/u - FA 4.27.23 shows inf macula w/ vascular perfusion defects, telangectasias and mild perivascular leakage - BCVA stable at 20/40 - OCT shows OD: persistent cystic changes temporal macula slightly improved, +lamellar macular hole - recommend IVE OD #5 today, 01.08.24 - pt wishes to proceed with injection - RBA of procedure discussed, questions answered - informed consent obtained and signed for IVA OD on 02.15.23, IVE OD 9.15.23 - see procedure note - F/U 4 weeks -- DFE/OCT/possible injection  2. Mild Non-proliferative  diabetic retinopathy, both eyes  - s/p IVA OS #1 (04.27.23), #2 (05.26.23), #  3 (06.23.23), #4 (07.21.23), #5 (08.15.23) -- IVA resistance  - s/p IVE OS #1 (09.15.23), #2 (10.13.23), #3 (11.10.23), #4 (12.07.23)  - A1c: 6.4 on 06/30/22 - FA (04.27.23) shows late leaking MA's OU, no NV OU - exam shows scattered MA/DBH OU - BCVA OS 20/25-1 (improved  from 20/30) - OCT shows OS: persistent IRF IT fovea - recommend IVE OS #5 today, 01.08.24 - pt wishes to proceed with injection - RBA of procedure discussed, questions answered - informed consent obtained and signed for IVA OS 4.27.23, IVE OS 9.15.23 - see procedure note - f/u in 4 wks -- DFE/OCT, possible injection  3,4. Hypertensive retinopathy OU - discussed importance of tight BP control - continue to monitor  5. Mixed Cataract OU - The symptoms of cataract, surgical options, and treatments and risks were discussed with patient. - discussed diagnosis and progression -continue to monitor  Ophthalmic Meds Ordered this visit:  Meds ordered this encounter  Medications   aflibercept (EYLEA) SOLN 2 mg   aflibercept (EYLEA) SOLN 2 mg     Return in about 4 weeks (around 01/04/2023) for f/u BRVO OD / NPDR OU, DFE, OCT.  There are no Patient Instructions on file for this visit.   Explained the diagnoses, plan, and follow up with the patient and they expressed understanding.  Patient expressed understanding of the importance of proper follow up care.   This document serves as a record of services personally performed by Gardiner Sleeper, MD, PhD. It was created on their behalf by Orvan Falconer, an ophthalmic technician. The creation of this record is the provider's dictation and/or activities during the visit.    Electronically signed by: Orvan Falconer, OA, 12/07/22  3:59 PM  This document serves as a record of services personally performed by Gardiner Sleeper, MD, PhD. It was created on their behalf by San Jetty. Owens Shark, OA an ophthalmic  technician. The creation of this record is the provider's dictation and/or activities during the visit.    Electronically signed by: San Jetty. Owens Shark, [place] 01.08.2024 3:59 PM  Gardiner Sleeper, M.D., Ph.D. Diseases & Surgery of the Retina and Vitreous [facility]  I have reviewed the above documentation for accuracy and completeness, and I agree with the above. Gardiner Sleeper, M.D., Ph.D. 12/07/22 4:02 PM  Abbreviations: M myopia (nearsighted); A astigmatism; H hyperopia (farsighted); P presbyopia; Mrx spectacle prescription;  CTL contact lenses; OD right eye; OS left eye; OU both eyes  XT exotropia; ET esotropia; PEK punctate epithelial keratitis; PEE punctate epithelial erosions; DES dry eye syndrome; MGD meibomian gland dysfunction; ATs artificial tears; PFAT's preservative free artificial tears; [place] nuclear sclerotic cataract; PSC posterior subcapsular cataract; ERM epi-retinal membrane; PVD posterior vitreous detachment; RD retinal detachment; DM diabetes mellitus; DR diabetic retinopathy; NPDR non-proliferative diabetic retinopathy; PDR proliferative diabetic retinopathy; CSME clinically significant macular edema; DME diabetic macular edema; dbh dot blot hemorrhages; CWS cotton wool spot; POAG primary open angle glaucoma; C/D cup-to-disc ratio; HVF humphrey visual field; GVF goldmann visual field; OCT optical coherence tomography; IOP intraocular pressure; BRVO Branch retinal vein occlusion; CRVO central retinal vein occlusion; CRAO central retinal artery occlusion; BRAO branch retinal artery occlusion; RT retinal tear; SB scleral buckle; PPV pars plana vitrectomy; VH Vitreous hemorrhage; PRP panretinal laser photocoagulation; IVK intravitreal kenalog; VMT vitreomacular traction; MH Macular hole;  NVD neovascularization of the disc; NVE neovascularization elsewhere; AREDS age related eye disease study; ARMD age related macular degeneration; POAG primary open angle glaucoma; EBMD  epithelial/anterior basement membrane dystrophy; ACIOL anterior chamber intraocular lens; IOL intraocular lens; PCIOL posterior chamber intraocular lens; Phaco/IOL phacoemulsification with intraocular lens placement; [place] photorefractive keratectomy; LASIK laser assisted in situ keratomileusis; HTN hypertension; DM diabetes mellitus; COPD chronic obstructive pulmonary disease

## 2022-12-07 ENCOUNTER — Ambulatory Visit (INDEPENDENT_AMBULATORY_CARE_PROVIDER_SITE_OTHER): Payer: Medicare HMO | Admitting: Ophthalmology

## 2022-12-07 ENCOUNTER — Encounter (INDEPENDENT_AMBULATORY_CARE_PROVIDER_SITE_OTHER): Payer: Self-pay | Admitting: Ophthalmology

## 2022-12-07 DIAGNOSIS — I10 Essential (primary) hypertension: Secondary | ICD-10-CM

## 2022-12-07 DIAGNOSIS — H25813 Combined forms of age-related cataract, bilateral: Secondary | ICD-10-CM

## 2022-12-07 DIAGNOSIS — H34831 Tributary (branch) retinal vein occlusion, right eye, with macular edema: Secondary | ICD-10-CM

## 2022-12-07 DIAGNOSIS — E113313 Type 2 diabetes mellitus with moderate nonproliferative diabetic retinopathy with macular edema, bilateral: Secondary | ICD-10-CM

## 2022-12-07 DIAGNOSIS — H35033 Hypertensive retinopathy, bilateral: Secondary | ICD-10-CM

## 2022-12-07 DIAGNOSIS — E113213 Type 2 diabetes mellitus with mild nonproliferative diabetic retinopathy with macular edema, bilateral: Secondary | ICD-10-CM

## 2022-12-07 MED ORDER — AFLIBERCEPT 2MG/0.05ML IZ SOLN FOR KALEIDOSCOPE
2.0000 mg | INTRAVITREAL | Status: AC | PRN
  Administered 2022-12-07: 2 mg via INTRAVITREAL

## 2022-12-27 ENCOUNTER — Ambulatory Visit
Admission: RE | Admit: 2022-12-27 | Discharge: 2022-12-27 | Disposition: A | Discharge status: Home / Self Care | Payer: Medicare HMO | Source: Ambulatory Visit | Attending: Internal Medicine | Admitting: Internal Medicine

## 2022-12-27 DIAGNOSIS — K862 Cyst of pancreas: Secondary | ICD-10-CM | POA: Diagnosis not present

## 2022-12-27 DIAGNOSIS — N2889 Other specified disorders of kidney and ureter: Secondary | ICD-10-CM | POA: Diagnosis not present

## 2022-12-27 MED ORDER — GADOPICLENOL 0.5 MMOL/ML IV SOLN
9.0000 mL | Freq: Once | INTRAVENOUS | Status: AC | PRN
  Administered 2022-12-27: 9 mL via INTRAVENOUS

## 2022-12-30 ENCOUNTER — Other Ambulatory Visit: Payer: Self-pay | Admitting: Internal Medicine

## 2022-12-30 NOTE — Progress Notes (Shared)
[facility] Note  01/04/2023     CHIEF COMPLAINT Patient presents for No chief complaint on file.   HISTORY OF PRESENT ILLNESS: Jesus Arnold is a 82 y.o. male who presents to the clinic today for:      Referring physician: Biagio Borg, MD [place],  [place] [address]  HISTORICAL INFORMATION:   Selected notes from the MEDICAL RECORD NUMBER Referred by Dr. [name] for concern of BRVO LEE:  Ocular Hx- former [facility] pt, last visit 2.20.2020 -- BRVO w/ CME, history of multiple IVA (14) from June 2018 to Feb 2020. PMH-    CURRENT MEDICATIONS: No current outpatient medications on file. (Ophthalmic Drugs)   No current facility-administered medications for this visit. (Ophthalmic Drugs)   Current Outpatient Medications (Other)  Medication Sig   atorvastatin (LIPITOR) 80 MG tablet Take 1 tablet (80 mg total) by mouth daily.   blood glucose meter kit and supplies KIT Dispense based on patient and insurance preference. Use up to four times daily as directed. (E11.9).   glucose blood (ONE TOUCH ULTRA TEST) test strip Use one strip per test. Test blood sugars 1-4 times daily as instructed.   Lancets Misc. (ONE TOUCH SURESOFT) MISC Use 1 lancet per test. Test blood sugars 1-4 times per day as instructed.   metFORMIN (GLUCOPHAGE-XR) 500 MG 24 hr tablet TAKE 3 TABLETS BY MOUTH EVERY DAY WITH BREAKFAST   mometasone (ELOCON) 0.1 % cream Apply topically 2 (two) times daily.   Multiple Vitamin (MULTIVITAMIN) tablet Take 1 tablet by mouth daily.   pantoprazole (PROTONIX) 40 MG tablet Take 1 tablet (40 mg total) by mouth daily. (Patient not taking: Reported on 02/03/2022)   No current facility-administered medications for this visit. 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  FAMILY HISTORY No family history on file.  SOCIAL HISTORY Social History   Tobacco Use   Smoking status: Former    Types: Cigarettes    Quit date: 01/28/2017    Years since quitting: 5.9   Smokeless tobacco: Never  Vaping Use   Vaping Use: Never used  Substance Use Topics   Alcohol use: No   Drug use: No       OPHTHALMIC EXAM:  Not recorded    IMAGING AND PROCEDURES  Imaging and Procedures for 01/04/2023          ASSESSMENT/PLAN:  No diagnosis found.  1. BRVO w/ CME OD  - s/p IVA OD #15 (02.15.23), #16 (04.27.23), #17 (05.26.23), #18 (06.23.23), #19 (07.21.23), #20 (08.15.23) -- IVA resistance  - s/p IVE OD #1 (09.15.23), #2 (10.13.23),  #3 (11.10.23),  #4 (12.07.23), #5 (01.08.24) - former pt of Dr. [name] -- history of IVA OD x14 from June 2018 to Feb 2020 -- then lost to retina f/u - FA 4.27.23 shows inf macula w/ vascular perfusion defects, telangectasias and mild perivascular leakage - BCVA stable at 20/40 - OCT shows OD: persistent cystic changes temporal macula slightly improved, +lamellar macular hole - recommend IVE OD #6 today, 02.05.24 - pt wishes to proceed with injection - RBA of procedure discussed, questions answered - informed consent obtained and signed for IVA OD on 02.15.23, IVE OD 9.15.23 -  see procedure note  - F/U 4 weeks -- DFE/OCT/possible injection  2. Mild Non-proliferative diabetic retinopathy, both eyes  - s/p IVA OS #1 (04.27.23), #2 (05.26.23), #3 (06.23.23), #4 (07.21.23), #5 (08.15.23) -- IVA resistance  - s/p IVE OS #1 (09.15.23), #2 (10.13.23), #3 (11.10.23), #4 (12.07.23), #5 (01.08.24)  - A1c: 6.4 on 06/30/22 - FA (04.27.23) shows late leaking MA's OU, no NV OU - exam shows scattered MA/DBH OU - BCVA OS 20/25-1 (improved  from 20/30) - OCT shows OS: persistent IRF IT fovea - recommend IVE OS #5 today, 02.05.24 - pt wishes to proceed with  injection - RBA of procedure discussed, questions answered - informed consent obtained and signed for IVA OS 4.27.23, IVE OS 9.15.23 - see procedure note  - f/u in 4 wks -- DFE/OCT, possible injection  3,4. Hypertensive retinopathy OU - discussed importance of tight BP control - continue to monitor  5. Mixed Cataract OU - The symptoms of cataract, surgical options, and treatments and risks were discussed with patient. - discussed diagnosis and progression -continue to monitor  Ophthalmic Meds Ordered this visit:  No orders of the defined types were placed in this encounter.    No follow-ups on file.  There are no Patient Instructions on file for this visit.   Explained the diagnoses, plan, and follow up with the patient and they expressed understanding.  Patient expressed understanding of the importance of proper follow up care.   This document serves as a record of services personally performed by Gardiner Sleeper, MD, PhD. It was created on their behalf by Orvan Falconer, an ophthalmic technician. The creation of this record is the provider's dictation and/or activities during the visit.    Electronically signed by: Orvan Falconer, OA, 12/30/22  3:11 PM    Gardiner Sleeper, M.D., Ph.D. Diseases & Surgery of the Retina and Vitreous [facility]: M myopia (nearsighted); A astigmatism; H hyperopia (farsighted); P presbyopia; Mrx spectacle prescription;  CTL contact lenses; OD right eye; OS left eye; OU both eyes  XT exotropia; ET esotropia; PEK punctate epithelial keratitis; PEE punctate epithelial erosions; DES dry eye syndrome; MGD meibomian gland dysfunction; ATs artificial tears; PFAT's preservative free artificial tears; [place] nuclear sclerotic cataract; PSC posterior subcapsular cataract; ERM epi-retinal membrane; PVD posterior vitreous detachment; RD retinal detachment; DM diabetes mellitus; DR diabetic retinopathy; NPDR  non-proliferative diabetic retinopathy; PDR proliferative diabetic retinopathy; CSME clinically significant macular edema; DME diabetic macular edema; dbh dot blot hemorrhages; CWS cotton wool spot; POAG primary open angle glaucoma; C/D cup-to-disc ratio; HVF humphrey visual field; GVF goldmann visual field; OCT optical coherence tomography; IOP intraocular pressure; BRVO Branch retinal vein occlusion; CRVO central retinal vein occlusion; CRAO central retinal artery occlusion; BRAO branch retinal artery occlusion; RT retinal tear; SB scleral buckle; PPV pars plana vitrectomy; VH Vitreous hemorrhage; PRP panretinal laser photocoagulation; IVK intravitreal kenalog; VMT vitreomacular traction; MH Macular hole;  NVD neovascularization of the disc; NVE neovascularization elsewhere; AREDS age related eye disease study; ARMD age related macular degeneration; POAG primary open angle glaucoma; EBMD epithelial/anterior basement membrane dystrophy; ACIOL anterior chamber intraocular lens; IOL intraocular lens; PCIOL posterior chamber intraocular lens; Phaco/IOL phacoemulsification with intraocular lens placement; [place] photorefractive keratectomy; LASIK laser assisted in situ keratomileusis; HTN hypertension; DM diabetes mellitus; COPD chronic obstructive pulmonary disease

## 2022-12-30 NOTE — Telephone Encounter (Signed)
Please refill as per office routine med refill policy (all routine meds to be refilled for 3 mo or monthly (per pt preference) up to one year from last visit, then month to month grace period for 3 mo, then further med refills will have to be denied)

## 2022-12-31 ENCOUNTER — Ambulatory Visit: Payer: Medicare HMO | Admitting: Internal Medicine

## 2023-01-04 ENCOUNTER — Encounter (INDEPENDENT_AMBULATORY_CARE_PROVIDER_SITE_OTHER): Payer: Medicare HMO | Admitting: Ophthalmology

## 2023-01-04 DIAGNOSIS — H34831 Tributary (branch) retinal vein occlusion, right eye, with macular edema: Secondary | ICD-10-CM

## 2023-01-04 DIAGNOSIS — H35033 Hypertensive retinopathy, bilateral: Secondary | ICD-10-CM

## 2023-01-04 DIAGNOSIS — I10 Essential (primary) hypertension: Secondary | ICD-10-CM

## 2023-01-04 DIAGNOSIS — E113213 Type 2 diabetes mellitus with mild nonproliferative diabetic retinopathy with macular edema, bilateral: Secondary | ICD-10-CM

## 2023-01-04 DIAGNOSIS — H25813 Combined forms of age-related cataract, bilateral: Secondary | ICD-10-CM

## 2023-01-19 ENCOUNTER — Other Ambulatory Visit: Payer: Self-pay | Admitting: Internal Medicine

## 2023-01-19 NOTE — Telephone Encounter (Signed)
Please refill as per office routine med refill policy (all routine meds to be refilled for 3 mo or monthly (per pt preference) up to one year from last visit, then month to month grace period for 3 mo, then further med refills will have to be denied) ? ?

## 2023-02-05 ENCOUNTER — Ambulatory Visit (INDEPENDENT_AMBULATORY_CARE_PROVIDER_SITE_OTHER): Payer: Medicare HMO | Admitting: Internal Medicine

## 2023-02-05 VITALS — BP 120/66 | HR 82 | Temp 98.6°F | Ht 72.0 in | Wt 195.0 lb

## 2023-02-05 DIAGNOSIS — E785 Hyperlipidemia, unspecified: Secondary | ICD-10-CM

## 2023-02-05 DIAGNOSIS — Z0001 Encounter for general adult medical examination with abnormal findings: Secondary | ICD-10-CM

## 2023-02-05 DIAGNOSIS — N1831 Chronic kidney disease, stage 3a: Secondary | ICD-10-CM

## 2023-02-05 DIAGNOSIS — R197 Diarrhea, unspecified: Secondary | ICD-10-CM | POA: Diagnosis not present

## 2023-02-05 DIAGNOSIS — E559 Vitamin D deficiency, unspecified: Secondary | ICD-10-CM

## 2023-02-05 DIAGNOSIS — E1165 Type 2 diabetes mellitus with hyperglycemia: Secondary | ICD-10-CM

## 2023-02-05 NOTE — Patient Instructions (Signed)

## 2023-02-05 NOTE — Progress Notes (Signed)
Patient ID: Rc Schiraldi, male   DOB: 10-24-1941, 82 y.o.   MRN: [id number]         Chief Complaint:: wellness exam and diarrhea, ckd, hld, dm, lo vit d       HPI:  Jesus Arnold is a 82 y.o. male here for wellness exam; for shingrix and prevnar at pharmacy, o/w up to date                        Also Pt denies chest pain, increased sob or doe, wheezing, orthopnea, PND, increased LE swelling, palpitations, dizziness or syncope.   Pt denies polydipsia, polyuria, or new focal neuro s/s.    Pt denies fever, wt loss, night sweats, loss of appetite, or other constitutional symptoms  Diarrhea resolved with metamucil.     Wt Readings from Last 3 Encounters:  02/05/23 195 lb (88.5 kg)  08/07/22 194 lb (88 kg)  06/30/22 192 lb 3.2 oz (87.2 kg)   BP Readings from Last 3 Encounters:  02/05/23 120/66  08/07/22 122/64  06/30/22 124/68   Immunization History  Administered Date(s) Administered   Fluad Quad(high Dose 65+) 08/28/2019, 01/27/2021, 08/07/2022   Influenza, High Dose Seasonal PF 09/30/2018   Influenza-Unspecified 09/30/2018   PFIZER(Purple Top)SARS-COV-2 Vaccination 05/14/2020, 06/04/2020, 03/05/2021   Pneumococcal Conjugate-13 11/17/2017, 05/09/2020   Tdap 11/17/2017   Health Maintenance Due  Topic Date Due   HEMOGLOBIN A1C  12/31/2022   Medicare Annual Wellness (AWV)  03/03/2023      Past Medical History:  Diagnosis Date   Diabetes mellitus without complication (HCC)    GERD (gastroesophageal reflux disease)    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 No pertinent surgical history.  reports that he quit smoking about 6 years ago. His smoking use included cigarettes. He has never used smokeless tobacco. He reports that he does not drink alcohol and does not use drugs. family history is not on file. No Known Allergies Current Outpatient Medications on File Prior to Visit  Medication Sig Dispense Refill   atorvastatin (LIPITOR) 80 MG tablet TAKE 1 TABLET  BY MOUTH EVERY DAY 90 tablet 3   blood glucose meter kit and supplies KIT Dispense based on patient and insurance preference. Use up to four times daily as directed. (E11.9). 1 each 0   glucose blood (ONE TOUCH ULTRA TEST) test strip Use one strip per test. Test blood sugars 1-4 times daily as instructed. 100 each 12   Lancets Misc. (ONE TOUCH SURESOFT) MISC Use 1 lancet per test. Test blood sugars 1-4 times per day as instructed. 1 each 1   metFORMIN (GLUCOPHAGE-XR) 500 MG 24 hr tablet TAKE 3 TABLETS BY MOUTH EVERY DAY WITH BREAKFAST 270 tablet 1   mometasone (ELOCON) 0.1 % cream Apply topically 2 (two) times daily. 45 g 1   Multiple Vitamin (MULTIVITAMIN) tablet Take 1 tablet by mouth daily. 90 tablet 1   pantoprazole (PROTONIX) 40 MG tablet Take 1 tablet (40 mg total) by mouth daily. (Patient not taking: Reported on 02/03/2022) 90 tablet 3   No current facility-administered medications on file prior to visit.        ROS:  All others reviewed and negative.  Objective        PE:  BP 120/66   Pulse 82   Temp 98.6 F (37 C) (Oral)   Ht 6' (1.829 m)   Wt 195 lb (88.5 kg)   SpO2 96%   BMI  26.45 kg/m                 Constitutional: Pt appears in NAD               HENT: Head: NCAT.                Right Ear: External ear normal.                 Left Ear: External ear normal.                Eyes: . Pupils are equal, round, and reactive to light. Conjunctivae and EOM are normal               Nose: without d/c or deformity               Neck: Neck supple. Gross normal ROM               Cardiovascular: Normal rate and regular rhythm.                 Pulmonary/Chest: Effort normal and breath sounds without rales or wheezing.                Abd:  Soft, NT, ND, + BS, no organomegaly               Neurological: Pt is alert. At baseline orientation, motor grossly intact               Skin: Skin is warm. No rashes, no other new lesions, LE edema - none               Psychiatric: Pt behavior is  normal without agitation   Micro: none  Cardiac tracings I have personally interpreted today:  none  Pertinent Radiological findings (summarize): none   Lab Results  Component Value Date   WBC 4.9 06/30/2022   HGB 12.7 (L) 06/30/2022   HCT 38.5 (L) 06/30/2022   PLT 205.0 06/30/2022   GLUCOSE 79 06/30/2022   CHOL 129 06/30/2022   TRIG 46.0 06/30/2022   HDL 38.80 (L) 06/30/2022   LDLCALC 81 06/30/2022   ALT 20 06/30/2022   AST 37 06/30/2022   NA 140 06/30/2022   K 4.0 06/30/2022   CL 105 06/30/2022   CREATININE 1.59 (H) 06/30/2022   BUN 17 06/30/2022   CO2 26 06/30/2022   TSH 1.62 06/30/2022   PSA 0.53 06/30/2022   HGBA1C 6.4 06/30/2022   MICROALBUR 2.2 (H) 06/30/2022   Assessment/Plan:  [name] is a 82 y.o. Black or African American [2] male with  has a past medical history of Diabetes mellitus without complication (HCC), GERD (gastroesophageal reflux disease), and Neuropathy (11/17/2017).  Encounter for well adult exam with abnormal findings Age and sex appropriate education and counseling updated with regular exercise and diet Referrals for preventative services - none needed Immunizations addressed - for prevnar and shignrx at pharmacy Smoking counseling  - none needed Evidence for depression or other mood disorder - none significant Most recent labs reviewed. I have personally reviewed and have noted: 1) the patient's medical and social history 2) The patient's current medications and supplements 3) The patient's height, weight, and BMI have been recorded in the chart   CKD (chronic kidney disease) stage 3, GFR 30-59 ml/min (HCC) Lab Results  Component Value Date   CREATININE 1.59 (H) 06/30/2022   Stable overall, cont to avoid nephrotoxins   Dyslipidemia Lab Results  Component Value  Date   LDLCALC 81 06/30/2022   Uncontrolled, goal ldl < 70, pt has been working on diet,  pt to continue current statin liptior 80 mg and f/u lipid   Type 2  diabetes mellitus (HCC) Lab Results  Component Value Date   HGBA1C 6.4 06/30/2022   Stable, pt to continue current medical treatment metformin ER 500 mg - 3 qd   Vitamin D deficiency Last vitamin D Lab Results  Component Value Date   VD25OH 37.40 06/30/2022   Low, to start oral replacement   Diarrhea Resolved with metamucil qd,  to f/u any worsening symptoms or concerns  Followup: Return in about 6 months (around 08/08/2023).  Oliver Barre, MD 02/06/2023 8:52 PM [facility]

## 2023-02-06 ENCOUNTER — Encounter: Payer: Self-pay | Admitting: Internal Medicine

## 2023-02-06 DIAGNOSIS — R197 Diarrhea, unspecified: Secondary | ICD-10-CM | POA: Insufficient documentation

## 2023-02-06 NOTE — Assessment & Plan Note (Signed)
Age and sex appropriate education and counseling updated with regular exercise and diet Referrals for preventative services - none needed Immunizations addressed - for prevnar and shignrx at pharmacy Smoking counseling  - none needed Evidence for depression or other mood disorder - none significant Most recent labs reviewed. I have personally reviewed and have noted: 1) the patient's medical and social history 2) The patient's current medications and supplements 3) The patient's height, weight, and BMI have been recorded in the chart

## 2023-02-06 NOTE — Assessment & Plan Note (Signed)
Last vitamin D Lab Results  Component Value Date   VD25OH 37.40 06/30/2022   Low, to start oral replacement

## 2023-02-06 NOTE — Assessment & Plan Note (Signed)
Lab Results  Component Value Date   LDLCALC 81 06/30/2022   Uncontrolled, goal ldl < 70, pt has been working on diet,  pt to continue current statin liptior 80 mg and f/u lipid

## 2023-02-06 NOTE — Assessment & Plan Note (Signed)
Lab Results  Component Value Date   HGBA1C 6.4 06/30/2022   Stable, pt to continue current medical treatment metformin ER 500 mg - 3 qd

## 2023-02-06 NOTE — Assessment & Plan Note (Signed)
Lab Results  Component Value Date   CREATININE 1.59 (H) 06/30/2022   Stable overall, cont to avoid nephrotoxins

## 2023-02-06 NOTE — Assessment & Plan Note (Signed)
Resolved with metamucil qd,  to f/u any worsening symptoms or concerns

## 2023-02-08 ENCOUNTER — Encounter: Payer: Self-pay | Admitting: Internal Medicine

## 2023-02-08 ENCOUNTER — Other Ambulatory Visit (INDEPENDENT_AMBULATORY_CARE_PROVIDER_SITE_OTHER): Payer: Medicare HMO

## 2023-02-08 DIAGNOSIS — E538 Deficiency of other specified B group vitamins: Secondary | ICD-10-CM

## 2023-02-08 DIAGNOSIS — E1165 Type 2 diabetes mellitus with hyperglycemia: Secondary | ICD-10-CM | POA: Diagnosis not present

## 2023-02-08 DIAGNOSIS — E559 Vitamin D deficiency, unspecified: Secondary | ICD-10-CM

## 2023-02-08 LAB — VITAMIN D 25 HYDROXY (VIT D DEFICIENCY, FRACTURES): VITD: 43.39 ng/mL (ref 30.00–100.00)

## 2023-02-08 LAB — BASIC METABOLIC PANEL
BUN: 9 mg/dL (ref 6–23)
CO2: 26 mEq/L (ref 19–32)
Calcium: 9.3 mg/dL (ref 8.4–10.5)
Chloride: 104 mEq/L (ref 96–112)
Creatinine, Ser: 1.32 mg/dL (ref 0.40–1.50)
GFR: 51.26 mL/min — ABNORMAL LOW (ref >60.00)
Glucose, Bld: 71 mg/dL (ref 70–99)
Potassium: 3.6 mEq/L (ref 3.5–5.1)
Sodium: 140 mEq/L (ref 135–145)

## 2023-02-08 LAB — CBC WITH DIFFERENTIAL/PLATELET
Basophils Absolute: 0 10*3/uL (ref 0.0–0.1)
Basophils Relative: 0.8 % (ref 0.0–3.0)
Eosinophils Absolute: 0.1 10*3/uL (ref 0.0–0.7)
Eosinophils Relative: 2.5 % (ref 0.0–5.0)
HCT: 40.4 % (ref 39.0–52.0)
Hemoglobin: 13.4 g/dL (ref 13.0–17.0)
Lymphocytes Relative: 30.3 % (ref 12.0–46.0)
Lymphs Abs: 1.5 10*3/uL (ref 0.7–4.0)
MCHC: 33.2 g/dL (ref 30.0–36.0)
MCV: 85.9 fl (ref 78.0–100.0)
Monocytes Absolute: 0.5 10*3/uL (ref 0.1–1.0)
Monocytes Relative: 10.1 % (ref 3.0–12.0)
Neutro Abs: 2.8 10*3/uL (ref 1.4–7.7)
Neutrophils Relative %: 56.3 % (ref 43.0–77.0)
Platelets: 228 10*3/uL (ref 150.0–400.0)
RBC: 4.71 Mil/uL (ref 4.22–5.81)
RDW: 16.8 % — ABNORMAL HIGH (ref 11.5–15.5)
WBC: 5 10*3/uL (ref 4.0–10.5)

## 2023-02-08 LAB — LIPID PANEL
Cholesterol: 113 mg/dL (ref 0–200)
HDL: 38.6 mg/dL — ABNORMAL LOW (ref >39.00)
LDL Cholesterol: 63 mg/dL (ref 0–99)
NonHDL: 74.65
Total CHOL/HDL Ratio: 3
Triglycerides: 57 mg/dL (ref 0.0–149.0)
VLDL: 11.4 mg/dL (ref 0.0–40.0)

## 2023-02-08 LAB — HEPATIC FUNCTION PANEL
ALT: 21 U/L (ref 0–53)
AST: 42 U/L — ABNORMAL HIGH (ref 0–37)
Albumin: 3.6 g/dL (ref 3.5–5.2)
Alkaline Phosphatase: 66 U/L (ref 39–117)
Bilirubin, Direct: 0.2 mg/dL (ref 0.0–0.3)
Total Bilirubin: 0.7 mg/dL (ref 0.2–1.2)
Total Protein: 7 g/dL (ref 6.0–8.3)

## 2023-02-08 LAB — VITAMIN B12: Vitamin B-12: 279 pg/mL (ref 211–911)

## 2023-02-08 LAB — HEMOGLOBIN A1C: Hgb A1c MFr Bld: 6.1 % (ref 4.6–6.5)

## 2023-02-08 LAB — TSH: TSH: 1.31 u[IU]/mL (ref 0.35–5.50)

## 2023-02-08 NOTE — Progress Notes (Signed)
Letter sent, cont same tx

## 2023-02-11 ENCOUNTER — Other Ambulatory Visit (INDEPENDENT_AMBULATORY_CARE_PROVIDER_SITE_OTHER): Payer: Medicare HMO

## 2023-02-11 DIAGNOSIS — E538 Deficiency of other specified B group vitamins: Secondary | ICD-10-CM

## 2023-02-11 DIAGNOSIS — E1165 Type 2 diabetes mellitus with hyperglycemia: Secondary | ICD-10-CM | POA: Diagnosis not present

## 2023-02-11 DIAGNOSIS — E559 Vitamin D deficiency, unspecified: Secondary | ICD-10-CM | POA: Diagnosis not present

## 2023-02-11 LAB — URINALYSIS, ROUTINE W REFLEX MICROSCOPIC
Bilirubin Urine: NEGATIVE
Hgb urine dipstick: NEGATIVE
Ketones, ur: NEGATIVE
Leukocytes,Ua: NEGATIVE
Nitrite: NEGATIVE
RBC / HPF: NONE SEEN (ref 0–?)
Specific Gravity, Urine: 1.02 (ref 1.000–1.030)
Total Protein, Urine: NEGATIVE
Urine Glucose: NEGATIVE
Urobilinogen, UA: 0.2 (ref 0.0–1.0)
pH: 6.5 (ref 5.0–8.0)

## 2023-02-11 LAB — MICROALBUMIN / CREATININE URINE RATIO
Creatinine,U: 117.4 mg/dL
Microalb Creat Ratio: 0.8 mg/g (ref 0.0–30.0)
Microalb, Ur: 0.9 mg/dL (ref 0.0–1.9)

## 2023-02-24 ENCOUNTER — Telehealth: Payer: Self-pay

## 2023-02-24 NOTE — Telephone Encounter (Signed)
Contacted Adolphus Denslow to schedule their annual wellness visit. Appointment made for 03/04/23.  Norton Blizzard, Florin (AAMA)  [place] [facility] [phone]

## 2023-03-04 ENCOUNTER — Telehealth: Payer: Self-pay

## 2023-03-04 ENCOUNTER — Ambulatory Visit (INDEPENDENT_AMBULATORY_CARE_PROVIDER_SITE_OTHER): Payer: Medicare HMO

## 2023-03-04 VITALS — Ht 72.0 in | Wt 190.0 lb

## 2023-03-04 DIAGNOSIS — Z0000 Encounter for general adult medical examination without abnormal findings: Secondary | ICD-10-CM

## 2023-03-04 NOTE — Telephone Encounter (Signed)
Patient returned call and AWV-S was completed.  Aava Deland N. Michell Kader, LPN. [place] Team Direct Dial: [phone]

## 2023-03-04 NOTE — Patient Instructions (Signed)
Jesus Arnold , Thank you for taking time to come for your Medicare Wellness Visit. I appreciate your ongoing commitment to your health goals. Please review the following plan we discussed and let me know if I can assist you in the future.   These are the goals we discussed:  Goals      Patient Stated     I want to continue to be happy and peaceful. Stay active and enjoy life. Start to monitor diet for sugar and carbohydrates. Drink more water.        This is a list of the screening recommended for you and due dates:  Health Maintenance  Topic Date Due   Zoster (Shingles) Vaccine (1 of 2) 05/08/2023*   Pneumonia Vaccine (2 of 2 - PPSV23 or PCV20) 02/05/2024*   Flu Shot  07/01/2023   Eye exam for diabetics  07/14/2023   Hemoglobin A1C  08/11/2023   Complete foot exam   02/05/2024   Yearly kidney function blood test for diabetes  02/08/2024   Yearly kidney health urinalysis for diabetes  02/11/2024   Medicare Annual Wellness Visit  03/03/2024   DTaP/Tdap/Td vaccine (2 - Td or Tdap) 11/18/2027   Hepatitis C Screening: USPSTF Recommendation to screen - Ages 53-79 yo.  Completed   HPV Vaccine  Aged Out   Colon Cancer Screening  Discontinued   COVID-19 Vaccine  Discontinued  *Topic was postponed. The date shown is not the original due date.    Advanced directives: No; Advance directive discussed with you today. Even though you declined this today please call our office should you change your mind and we can give you the proper paperwork for you to fill out.  Conditions/risks identified: Yes, Type II Diabetes  Next appointment: Follow up in one year for your annual wellness visit.   Preventive Care 45 Years and Older, Male  Preventive care refers to lifestyle choices and visits with your health care provider that can promote health and wellness. What does preventive care include? A yearly physical exam. This is also called an annual well check. Dental exams once or twice a  year. Routine eye exams. Ask your health care provider how often you should have your eyes checked. Personal lifestyle choices, including: Daily care of your teeth and gums. Regular physical activity. Eating a healthy diet. Avoiding tobacco and drug use. Limiting alcohol use. Practicing safe sex. Taking low doses of aspirin every day. Taking vitamin and mineral supplements as recommended by your health care provider. What happens during an annual well check? The services and screenings done by your health care provider during your annual well check will depend on your age, overall health, lifestyle risk factors, and family history of disease. Counseling  Your health care provider may ask you questions about your: Alcohol use. Tobacco use. Drug use. Emotional well-being. Home and relationship well-being. Sexual activity. Eating habits. History of falls. Memory and ability to understand (cognition). Work and work Statistician. Screening  You may have the following tests or measurements: Height, weight, and BMI. Blood pressure. Lipid and cholesterol levels. These may be checked every 5 years, or more frequently if you are over 22 years old. Skin check. Lung cancer screening. You may have this screening every year starting at age 25 if you have a 30-pack-year history of smoking and currently smoke or have quit within the past 15 years. Fecal occult blood test (FOBT) of the stool. You may have this test every year starting at age 13. Flexible  sigmoidoscopy or colonoscopy. You may have a sigmoidoscopy every 5 years or a colonoscopy every 10 years starting at age 3. Prostate cancer screening. Recommendations will vary depending on your family history and other risks. Hepatitis C blood test. Hepatitis B blood test. Sexually transmitted disease (STD) testing. Diabetes screening. This is done by checking your blood sugar (glucose) after you have not eaten for a while (fasting). You may  have this done every 1-3 years. Abdominal aortic aneurysm (AAA) screening. You may need this if you are a current or former smoker. Osteoporosis. You may be screened starting at age 17 if you are at high risk. Talk with your health care provider about your test results, treatment options, and if necessary, the need for more tests. Vaccines  Your health care provider may recommend certain vaccines, such as: Influenza vaccine. This is recommended every year. Tetanus, diphtheria, and acellular pertussis (Tdap, Td) vaccine. You may need a Td booster every 10 years. Zoster vaccine. You may need this after age 21. Pneumococcal 13-valent conjugate (PCV13) vaccine. One dose is recommended after age 25. Pneumococcal polysaccharide (PPSV23) vaccine. One dose is recommended after age 30. Talk to your health care provider about which screenings and vaccines you need and how often you need them. This information is not intended to replace advice given to you by your health care provider. Make sure you discuss any questions you have with your health care provider. Document Released: 12/13/2015 Document Revised: 08/05/2016 Document Reviewed: 09/17/2015 Elsevier Interactive Patient Education  2017 [employer] Prevention in the Home Falls can cause injuries. They can happen to people of all ages. There are many things you can do to make your home safe and to help prevent falls. What can I do on the outside of my home? Regularly fix the edges of walkways and driveways and fix any cracks. Remove anything that might make you trip as you walk through a door, such as a raised step or threshold. Trim any bushes or trees on the path to your home. Use bright outdoor lighting. Clear any walking paths of anything that might make someone trip, such as rocks or tools. Regularly check to see if handrails are loose or broken. Make sure that both sides of any steps have handrails. Any raised decks and porches  should have guardrails on the edges. Have any leaves, snow, or ice cleared regularly. Use sand or salt on walking paths during winter. Clean up any spills in your garage right away. This includes oil or grease spills. What can I do in the bathroom? Use night lights. Install grab bars by the toilet and in the tub and shower. Do not use towel bars as grab bars. Use non-skid mats or decals in the tub or shower. If you need to sit down in the shower, use a plastic, non-slip stool. Keep the floor dry. Clean up any water that spills on the floor as soon as it happens. Remove soap buildup in the tub or shower regularly. Attach bath mats securely with double-sided non-slip rug tape. Do not have throw rugs and other things on the floor that can make you trip. What can I do in the bedroom? Use night lights. Make sure that you have a light by your bed that is easy to reach. Do not use any sheets or blankets that are too big for your bed. They should not hang down onto the floor. Have a firm chair that has side arms. You can use this for  support while you get dressed. Do not have throw rugs and other things on the floor that can make you trip. What can I do in the kitchen? Clean up any spills right away. Avoid walking on wet floors. Keep items that you use a lot in easy-to-reach places. If you need to reach something above you, use a strong step stool that has a grab bar. Keep electrical cords out of the way. Do not use floor polish or wax that makes floors slippery. If you must use wax, use non-skid floor wax. Do not have throw rugs and other things on the floor that can make you trip. What can I do with my stairs? Do not leave any items on the stairs. Make sure that there are handrails on both sides of the stairs and use them. Fix handrails that are broken or loose. Make sure that handrails are as long as the stairways. Check any carpeting to make sure that it is firmly attached to the stairs.  Fix any carpet that is loose or worn. Avoid having throw rugs at the top or bottom of the stairs. If you do have throw rugs, attach them to the floor with carpet tape. Make sure that you have a light switch at the top of the stairs and the bottom of the stairs. If you do not have them, ask someone to add them for you. What else can I do to help prevent falls? Wear shoes that: Do not have high heels. Have rubber bottoms. Are comfortable and fit you well. Are closed at the toe. Do not wear sandals. If you use a stepladder: Make sure that it is fully opened. Do not climb a closed stepladder. Make sure that both sides of the stepladder are locked into place. Ask someone to hold it for you, if possible. Clearly mark and make sure that you can see: Any grab bars or handrails. First and last steps. Where the edge of each step is. Use tools that help you move around (mobility aids) if they are needed. These include: Canes. Walkers. Scooters. Crutches. Turn on the lights when you go into a dark area. Replace any light bulbs as soon as they burn out. Set up your furniture so you have a clear path. Avoid moving your furniture around. If any of your floors are uneven, fix them. If there are any pets around you, be aware of where they are. Review your medicines with your doctor. Some medicines can make you feel dizzy. This can increase your chance of falling. Ask your doctor what other things that you can do to help prevent falls. This information is not intended to replace advice given to you by your health care provider. Make sure you discuss any questions you have with your health care provider. Document Released: 09/12/2009 Document Revised: 04/23/2016 Document Reviewed: 12/21/2014 Elsevier Interactive Patient Education  2017 [employer].

## 2023-03-04 NOTE — Progress Notes (Signed)
I connected with  Stancil Coelho on 03/04/23 by a audio enabled telemedicine application and verified that I am speaking with the correct person using two identifiers.  Patient Location: Home  Provider Location: Office/Clinic  I discussed the limitations of evaluation and management by telemedicine. The patient expressed understanding and agreed to proceed.  Subjective:   Jesus Arnold is a 82 y.o. male who presents for Medicare Annual/Subsequent preventive examination.  Review of Systems     Cardiac Risk Factors include: advanced age (>37men, >51 women);diabetes mellitus;dyslipidemia;male gender     Objective:    Today's Vitals   03/04/23 1436  Weight: 190 lb (86.2 kg)  Height: 6' (1.829 m)  PainSc: 0-No pain   Body mass index is 25.77 kg/m.     03/04/2023    2:38 PM 03/02/2022    1:34 PM 09/12/2019    1:08 PM 06/30/2018    6:03 PM 12/31/2016   12:46 PM  Advanced Directives  Does Patient Have a Medical Advance Directive? No No No No No  Would patient like information on creating a medical advance directive? No - Patient declined No - Patient declined No - Patient declined Yes (ED - Information included in AVS) No - Patient declined    Current Medications (verified) Outpatient Encounter Medications as of 03/04/2023  Medication Sig   atorvastatin (LIPITOR) 80 MG tablet TAKE 1 TABLET BY MOUTH EVERY DAY   blood glucose meter kit and supplies KIT Dispense based on patient and insurance preference. Use up to four times daily as directed. (E11.9).   glucose blood (ONE TOUCH ULTRA TEST) test strip Use one strip per test. Test blood sugars 1-4 times daily as instructed.   Lancets Misc. (ONE TOUCH SURESOFT) MISC Use 1 lancet per test. Test blood sugars 1-4 times per day as instructed.   metFORMIN (GLUCOPHAGE-XR) 500 MG 24 hr tablet TAKE 3 TABLETS BY MOUTH EVERY DAY WITH BREAKFAST   mometasone (ELOCON) 0.1 % cream Apply topically 2 (two) times daily.   Multiple Vitamin (MULTIVITAMIN)  tablet Take 1 tablet by mouth daily.   pantoprazole (PROTONIX) 40 MG tablet Take 1 tablet (40 mg total) by mouth daily. (Patient not taking: Reported on 02/03/2022)   No facility-administered encounter medications on file as of 03/04/2023.    Allergies (verified) Patient has no known allergies.   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 No family history on file. Social History   Socioeconomic History   Marital status: Single    Spouse name: Not on file   Number of children: 2   Years of education: 10   Highest education level: Not on file  Occupational History   Occupation: Retired  Tobacco Use   Smoking status: Former    Types: Cigarettes    Quit date: 01/28/2017    Years since quitting: 6.0   Smokeless tobacco: Never  Vaping Use   Vaping Use: Never used  Substance and Sexual Activity   Alcohol use: No   Drug use: No   Sexual activity: Not Currently  Other Topics Concern   Not on file  Social History Narrative   Fun/Hobby: Relax; Watch tv, walking.    Social Determinants of Health   Financial Resource Strain: Low Risk  (03/04/2023)   Overall Financial Resource Strain (CARDIA)    Difficulty of Paying Living  Expenses: Not hard at all  Food Insecurity: No Food Insecurity (03/04/2023)   Hunger Vital Sign    Worried About Running Out of Food in the Last Year: Never true    Ran Out of Food in the Last Year: Never true  Transportation Needs: No Transportation Needs (03/04/2023)   PRAPARE - Hydrologist (Medical): No    Lack of Transportation (Non-Medical): No  Physical Activity: Sufficiently Active (03/04/2023)   Exercise Vital Sign    Days of Exercise per Week: 5 days    Minutes of Exercise per Session: 30 min  Stress: No Stress Concern Present (03/04/2023)   [place]    Feeling of Stress : Not at all  Social Connections: Socially Isolated (03/04/2023)   Social Connection and Isolation Panel [NHANES]    Frequency of Communication with Friends and Family: More than three times a week    Frequency of Social Gatherings with Friends and Family: More than three times a week    Attends Religious Services: Never    Marine scientist or Organizations: No    Attends Music therapist: Never    Marital Status: Never married    Tobacco Counseling Counseling given: Not Answered   Clinical Intake:  Pre-visit preparation completed: Yes  Pain : No/denies pain Pain Score: 0-No pain     BMI - recorded: 25.77 Nutritional Status: BMI 25 -29 Overweight Nutritional Risks: None Diabetes: Yes CBG done?: Yes (84) CBG resulted in Enter/ Edit results?: Yes Did pt. bring in CBG monitor from home?: No  How often do you need to have someone help you when you read instructions, pamphlets, or other written materials from your doctor or pharmacy?: 1 - Never What is the last grade level you completed in school?: 10th grade  Nutrition Risk Assessment:  Has the patient had any N/V/D within the last 2 months?  No  Does the patient have any non-healing wounds?  No  Has the patient had any unintentional weight loss or weight gain?  No   Diabetes:  Is the patient diabetic?  Yes  If diabetic, was a CBG obtained today?  No  Did the patient bring in their glucometer from home?  No  How often do you monitor your CBG's? Every morning.   Financial Strains and Diabetes Management:  Are you having any financial strains with the device, your supplies or your medication? No .  Does the patient want to be seen by Chronic Care Management for management of their diabetes?  No  Would the patient like to be referred to a Nutritionist or for Diabetic Management?  No   Diabetic Exams:  Diabetic Eye Exam:  Completed 07/13/2022 Diabetic Foot Exam: Completed 02/05/2023   Interpreter Needed?: No  Information entered by :: Lisette Abu, LPN.   Activities of Daily Living    03/04/2023    2:51 PM  In your present state of health, do you have any difficulty performing the following activities:  Hearing? 0  Vision? 0  Difficulty concentrating or making decisions? 0  Walking or climbing stairs? 0  Dressing or bathing? 0  Doing errands, shopping? 0  Preparing Food and eating ? N  Using the Toilet? N  In the past six months, have you accidently leaked urine? N  Do you have problems with loss of bowel control? N  Managing your Medications? N  Managing your Finances? N  Housekeeping or  managing your Housekeeping? N    Patient Care Team: Biagio Borg, MD as PCP - General (Internal Medicine) Lisabeth Pick, MD as Consulting Physician (Ophthalmology) [name], MD as Consulting Physician (Ophthalmology)  Indicate any recent Medical Services you may have received from other than [facility] providers in the past year (date may be approximate).     Assessment:   This is a routine wellness examination for [place].  Hearing/Vision screen Hearing Screening - Comments:: Denies hearing difficulties   Vision Screening - Comments:: Wears rx glasses - up to date with routine eye exams with Arnoldo Hooker, MD and [name], MD.  Dietary issues and exercise activities discussed: Current Exercise Habits: Home exercise routine, Type of exercise: walking, Time (Minutes): 30, Frequency (Times/Week): 5, Weekly Exercise (Minutes/Week): 150, Intensity: Moderate, Exercise limited by: None identified   Goals Addressed   None   Depression Screen    03/04/2023    2:49 PM 08/07/2022    3:12 PM 06/30/2022    2:10 PM 03/02/2022    1:37 PM 02/03/2022   10:09 AM 08/05/2021    2:48 PM 01/27/2021    3:26 PM  PHQ 2/9 Scores  PHQ - 2 Score 0  0 0 0 0 0  PHQ- 9 Score 0  0      Exception Documentation  Patient refusal          Fall Risk    03/04/2023    2:41 PM 02/05/2023    2:57 PM 08/07/2022    3:12 PM 06/30/2022    2:10 PM 03/02/2022    1:45 PM  [place] in the past year? 0 0 0 0 0  Number falls in past yr: 0 0  0 0  Injury with Fall? 0 0 0 0 0  Risk for fall due to : No Fall Risks No Fall Risks No Fall Risks  No Fall Risks  Follow up Falls prevention discussed Falls evaluation completed;Education provided Falls evaluation completed  Falls evaluation completed    FALL RISK PREVENTION PERTAINING TO THE HOME:  Any stairs in or around the home? No  If so, are there any without handrails? No  Home free of loose throw rugs in walkways, pet beds, electrical cords, etc? Yes  Adequate lighting in your home to reduce risk of falls? Yes   ASSISTIVE DEVICES UTILIZED TO PREVENT FALLS:  Life alert? No  Use of a cane, walker or w/c? Yes  Grab bars in the bathroom? Yes  Shower chair or bench in shower? Yes  Elevated toilet seat or a handicapped toilet? No   TIMED UP AND GO:  Was the test performed? No . Telephonic Visit  Cognitive Function:    06/30/2018    6:10 PM  MMSE - Mini Mental State Exam  Orientation to time 5  Orientation to Place 5  Registration 3  Attention/ Calculation 5  Recall 2  Language- name 2 objects 2  Language- repeat 1  Language- follow 3 step command 3  Language- read & follow direction 1  Write a sentence 1  Copy design 1  Total score 29        03/04/2023    2:42 PM 03/02/2022    1:46 PM  6CIT Screen  What Year? 0 points 0 points  What month? 0 points 0 points  What time? 0 points 0 points  Count back from 20 0 points 0 points  Months in reverse 0 points 0 points  Repeat phrase  0 points 0 points  Total Score 0 points 0 points    Immunizations Immunization History  Administered Date(s) Administered   Fluad Quad(high Dose 65+) 08/28/2019, 01/27/2021, 08/07/2022   Influenza, High Dose Seasonal PF 09/30/2018   Influenza-Unspecified 09/30/2018   PFIZER(Purple  Top)SARS-COV-2 Vaccination 05/14/2020, 06/04/2020, 03/05/2021   Pneumococcal Conjugate-13 11/17/2017, 05/09/2020   Tdap 11/17/2017    TDAP status: Up to date  Flu Vaccine status: Up to date  Pneumococcal vaccine status: Due, Education has been provided regarding the importance of this vaccine. Advised may receive this vaccine at local pharmacy or Health Dept. Aware to provide a copy of the vaccination record if obtained from local pharmacy or Health Dept. Verbalized acceptance and understanding.  Covid-19 vaccine status: Completed vaccines  Qualifies for Shingles Vaccine? Yes   Zostavax completed No   Shingrix Completed?: No.    Education has been provided regarding the importance of this vaccine. Patient has been advised to call insurance company to determine out of pocket expense if they have not yet received this vaccine. Advised may also receive vaccine at local pharmacy or Health Dept. Verbalized acceptance and understanding.  Screening Tests Health Maintenance  Topic Date Due   Zoster Vaccines- Shingrix (1 of 2) 05/08/2023 (Originally 06/21/1993)   Pneumonia Vaccine 1+ Years old (2 of 2 - PPSV23 or PCV20) 02/05/2024 (Originally 05/09/2021)   INFLUENZA VACCINE  07/01/2023   OPHTHALMOLOGY EXAM  07/14/2023   HEMOGLOBIN A1C  08/11/2023   FOOT EXAM  02/05/2024   Diabetic kidney evaluation - eGFR measurement  02/08/2024   Diabetic kidney evaluation - Urine ACR  02/11/2024   Medicare Annual Wellness (AWV)  03/03/2024   DTaP/Tdap/Td (2 - Td or Tdap) 11/18/2027   Hepatitis C Screening  Completed   HPV VACCINES  Aged Out   COLONOSCOPY (Pts 45-24yrs Insurance coverage will need to be confirmed)  Discontinued   COVID-19 Vaccine  Discontinued    Health Maintenance  There are no preventive care reminders to display for this patient.   Colorectal cancer screening: No longer required.   Lung Cancer Screening: (Low Dose CT Chest recommended if Age 26-80 years, 30 pack-year currently  smoking OR have quit w/in 15years.) does not qualify.   Lung Cancer Screening Referral: no  Additional Screening:  Hepatitis C Screening: does qualify; Completed 01/27/2021  Vision Screening: Recommended annual ophthalmology exams for early detection of glaucoma and other disorders of the eye. Is the patient up to date with their annual eye exam?  Yes  Who is the provider or what is the name of the office in which the patient attends annual eye exams? Arnoldo Hooker, MD. If pt is not established with a provider, would they like to be referred to a provider to establish care? No .   Dental Screening: Recommended annual dental exams for proper oral hygiene  Community Resource Referral / Chronic Care Management: CRR required this visit?  No   CCM required this visit?  No      Plan:     I have personally reviewed and noted the following in the patient's chart:   Medical and social history Use of alcohol, tobacco or illicit drugs  Current medications and supplements including opioid prescriptions. Patient is not currently taking opioid prescriptions. Functional ability and status Nutritional status Physical activity Advanced directives List of other physicians Hospitalizations, surgeries, and ER visits in previous 12 months Vitals Screenings to include cognitive, depression, and falls Referrals and appointments  In addition, I have reviewed and discussed  with patient certain preventive protocols, quality metrics, and best practice recommendations. A written personalized care plan for preventive services as well as general preventive health recommendations were provided to patient.     Sheral Flow, LPN   [id number]   Nurse Notes:  Normal cognitive status assessed by direct observation via phone by this Nurse Health Advisor. No abnormalities found.

## 2023-06-23 ENCOUNTER — Other Ambulatory Visit: Payer: Self-pay

## 2023-06-23 ENCOUNTER — Other Ambulatory Visit: Payer: Self-pay | Admitting: Internal Medicine

## 2023-08-09 ENCOUNTER — Encounter: Payer: Self-pay | Admitting: Internal Medicine

## 2023-08-09 ENCOUNTER — Ambulatory Visit: Payer: Medicare HMO | Admitting: Internal Medicine

## 2023-08-09 VITALS — BP 122/80 | HR 94 | Temp 98.2°F | Ht 72.0 in | Wt 196.0 lb

## 2023-08-09 DIAGNOSIS — N1831 Chronic kidney disease, stage 3a: Secondary | ICD-10-CM

## 2023-08-09 DIAGNOSIS — Z7984 Long term (current) use of oral hypoglycemic drugs: Secondary | ICD-10-CM | POA: Diagnosis not present

## 2023-08-09 DIAGNOSIS — E1165 Type 2 diabetes mellitus with hyperglycemia: Secondary | ICD-10-CM | POA: Diagnosis not present

## 2023-08-09 DIAGNOSIS — E559 Vitamin D deficiency, unspecified: Secondary | ICD-10-CM | POA: Diagnosis not present

## 2023-08-09 DIAGNOSIS — H538 Other visual disturbances: Secondary | ICD-10-CM | POA: Diagnosis not present

## 2023-08-09 DIAGNOSIS — K219 Gastro-esophageal reflux disease without esophagitis: Secondary | ICD-10-CM | POA: Diagnosis not present

## 2023-08-09 DIAGNOSIS — E785 Hyperlipidemia, unspecified: Secondary | ICD-10-CM

## 2023-08-09 DIAGNOSIS — Z23 Encounter for immunization: Secondary | ICD-10-CM

## 2023-08-09 LAB — HEPATIC FUNCTION PANEL
ALT: 36 U/L (ref 0–53)
AST: 56 U/L — ABNORMAL HIGH (ref 0–37)
Albumin: 3.7 g/dL (ref 3.5–5.2)
Alkaline Phosphatase: 78 U/L (ref 39–117)
Bilirubin, Direct: 0.1 mg/dL (ref 0.0–0.3)
Total Bilirubin: 0.6 mg/dL (ref 0.2–1.2)
Total Protein: 7.2 g/dL (ref 6.0–8.3)

## 2023-08-09 LAB — BASIC METABOLIC PANEL
BUN: 11 mg/dL (ref 6–23)
CO2: 28 meq/L (ref 19–32)
Calcium: 9.3 mg/dL (ref 8.4–10.5)
Chloride: 105 meq/L (ref 96–112)
Creatinine, Ser: 1.37 mg/dL (ref 0.40–1.50)
GFR: 48.85 mL/min — ABNORMAL LOW (ref >60.00)
Glucose, Bld: 79 mg/dL (ref 70–99)
Potassium: 4 meq/L (ref 3.5–5.1)
Sodium: 140 meq/L (ref 135–145)

## 2023-08-09 LAB — LIPID PANEL
Cholesterol: 102 mg/dL (ref 0–200)
HDL: 42.4 mg/dL (ref >39.00)
LDL Cholesterol: 46 mg/dL (ref 0–99)
NonHDL: 59.21
Total CHOL/HDL Ratio: 2
Triglycerides: 64 mg/dL (ref 0.0–149.0)
VLDL: 12.8 mg/dL (ref 0.0–40.0)

## 2023-08-09 LAB — HEMOGLOBIN A1C: Hgb A1c MFr Bld: 6.3 % (ref 4.6–6.5)

## 2023-08-09 LAB — VITAMIN D 25 HYDROXY (VIT D DEFICIENCY, FRACTURES): VITD: 54.66 ng/mL (ref 30.00–100.00)

## 2023-08-09 NOTE — Progress Notes (Signed)
Patient ID: Jesus Arnold, male   DOB: [birth year], 82 y.o.   MRN: [id number]        Chief Complaint: follow up HLD and DM, low vit d, ckd3a       HPI:  Jesus Arnold is a 82 y.o. male here overall doing ok,  Pt denies chest pain, increased sob or doe, wheezing, orthopnea, PND, increased LE swelling, palpitations, dizziness or syncope.   Pt denies polydipsia, polyuria, or new focal neuro s/s.    Pt denies fever, wt loss, night sweats, loss of appetite, or other constitutional symptoms  Has had mild worsening reflux, but no abd pain, dysphagia, n/v, bowel change or blood, and always improved with mylanta prn.  Does also have intermittent blurriness of right eye, has seen what sounds like optho then retinal specialist but put off by copays and signing documents after eye tx, asks for new optho referral.         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 No pertinent surgical history.  reports that he quit smoking about 6 years ago. His smoking use included cigarettes. He has never used smokeless tobacco. He reports that he does not drink alcohol and does not use drugs. family history is not on file. No Known Allergies Current Outpatient Medications on File Prior to Visit  Medication Sig Dispense Refill   atorvastatin (LIPITOR) 80 MG tablet TAKE 1 TABLET BY MOUTH EVERY DAY 90 tablet 3   betamethasone, augmented, (DIPROLENE) 0.05 % lotion Apply topically daily as needed.     blood glucose meter kit and supplies KIT Dispense based on patient and insurance preference. Use up to four times daily as directed. (E11.9). 1 each 0   glucose  blood (ONE TOUCH ULTRA TEST) test strip Use one strip per test. Test blood sugars 1-4 times daily as instructed. 100 each 12   Lancets Misc. (ONE TOUCH SURESOFT) MISC Use 1 lancet per test. Test blood sugars 1-4 times per day as instructed. 1 each 1   metFORMIN (GLUCOPHAGE-XR) 500 MG 24 hr tablet TAKE 3 TABLETS BY MOUTH EVERY DAY WITH BREAKFAST 270 tablet 1   mometasone (ELOCON) 0.1 % cream Apply topically 2 (two) times daily. 45 g 1   Multiple Vitamin (MULTIVITAMIN) tablet Take 1 tablet by mouth daily. 90 tablet 1   pantoprazole (PROTONIX) 40 MG tablet Take 1 tablet (40 mg total) by mouth daily. (Patient not taking: Reported on 02/03/2022) 90 tablet 3   No current facility-administered medications on file prior to visit.        ROS:  All others reviewed and negative.  Objective        PE:  BP 122/80 (BP Location: Right Arm, Patient Position: Sitting, Cuff Size: Normal)   Pulse 94   Temp 98.2 F (36.8 C) (Oral)   Ht 6' (1.829 m)   Wt 196 lb (88.9 kg)   SpO2 98%   BMI 26.58 kg/m                 Constitutional: Pt appears in NAD  HENT: Head: NCAT.                Right Ear: External ear normal.                 Left Ear: External ear normal.                Eyes: . Pupils are equal, round, and reactive to light. Conjunctivae and EOM are normal               Nose: without d/c or deformity               Neck: Neck supple. Gross normal ROM               Cardiovascular: Normal rate and regular rhythm.                 Pulmonary/Chest: Effort normal and breath sounds without rales or wheezing.                Abd:  Soft, NT, ND, + BS, no organomegaly               Neurological: Pt is alert. At baseline orientation, motor grossly intact               Skin: Skin is warm. No rashes, no other new lesions, LE edema - none               Psychiatric: Pt behavior is normal without agitation   Micro: none  Cardiac tracings I have personally interpreted today:  none  Pertinent  Radiological findings (summarize): none   Lab Results  Component Value Date   WBC 5.0 02/08/2023   HGB 13.4 02/08/2023   HCT 40.4 02/08/2023   PLT 228.0 02/08/2023   GLUCOSE 71 02/08/2023   CHOL 113 02/08/2023   TRIG 57.0 02/08/2023   HDL 38.60 (L) 02/08/2023   LDLCALC 63 02/08/2023   ALT 21 02/08/2023   AST 42 (H) 02/08/2023   NA 140 02/08/2023   K 3.6 02/08/2023   CL 104 02/08/2023   CREATININE 1.32 02/08/2023   BUN 9 02/08/2023   CO2 26 02/08/2023   TSH 1.31 02/08/2023   PSA 0.53 06/30/2022   HGBA1C 6.1 02/08/2023   MICROALBUR 0.9 02/11/2023   Assessment/Plan:  Jesus Arnold is a 82 y.o. Black or African American [2] male with  has a past medical history of Diabetes mellitus without complication (HCC), GERD (gastroesophageal reflux disease), and Neuropathy (11/17/2017).  Vitamin D deficiency Last vitamin D Lab Results  Component Value Date   VD25OH 43.39 02/08/2023   Stable, cont oral replacement   Type 2 diabetes mellitus (HCC) Lab Results  Component Value Date   HGBA1C 6.1 02/08/2023   Stable, pt to continue current medical treatment metfomrin ER 500 mg- 3 qd   Dyslipidemia Lab Results  Component Value Date   LDLCALC 63 02/08/2023   Stable, pt to continue current statin lipitor 80 mg qd   CKD (chronic kidney disease) stage 3, GFR 30-59 ml/min (HCC) Lab Results  Component Value Date   CREATININE 1.32 02/08/2023   Stable overall, cont to avoid nephrotoxins   GERD Mild worsening, for continue protonix, and mylanta prn  Blurred vision, right eye 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  Oliver Barre, MD 08/09/2023 7:54 PM [facility]

## 2023-08-09 NOTE — Assessment & Plan Note (Signed)
Mild worsening, for continue protonix, and mylanta prn

## 2023-08-09 NOTE — Assessment & Plan Note (Signed)
Lab Results  Component Value Date   CREATININE 1.32 02/08/2023   Stable overall, cont to avoid nephrotoxins

## 2023-08-09 NOTE — Assessment & Plan Note (Signed)
Lab Results  Component Value Date   LDLCALC 63 02/08/2023   Stable, pt to continue current statin lipitor 80 mg qd

## 2023-08-09 NOTE — Patient Instructions (Addendum)
Please continue all other medications as before, including the myalanta as needed  Please have the pharmacy call with any other refills you may need.  Please continue your efforts at being more active, low cholesterol diet, and weight control.  You are otherwise up to date with prevention measures today.  Please keep your appointments with your specialists as you may have planned  You will be contacted regarding the referral for: eye doctor - Dr Dione Booze  Please go to the LAB at the blood drawing area for the tests to be done  You will be contacted by phone if any changes need to be made immediately.  Otherwise, you will receive a letter about your results with an explanation, but please check with MyChart first.  Please remember to sign up for MyChart if you have not done so, as this will be important to you in the future with finding out test results, communicating by private email, and scheduling acute appointments online when needed.  Please make an Appointment to return in 6 months, or sooner if needed, also with Lab Appointment for testing done 3-5 days before at the FIRST FLOOR Lab (so this is for TWO appointments - please see the scheduling desk as you leave)

## 2023-08-09 NOTE — Assessment & Plan Note (Signed)
Last vitamin D Lab Results  Component Value Date   VD25OH 43.39 02/08/2023   Stable, cont oral replacement

## 2023-08-09 NOTE — Assessment & Plan Note (Signed)
Lab Results  Component Value Date   HGBA1C 6.1 02/08/2023   Stable, pt to continue current medical treatment metfomrin ER 500 mg- 3 qd

## 2023-08-09 NOTE — Assessment & Plan Note (Signed)
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

## 2023-08-10 ENCOUNTER — Encounter: Payer: Self-pay | Admitting: Internal Medicine

## 2023-08-10 LAB — MICROALBUMIN / CREATININE URINE RATIO
Creatinine,U: 188.8 mg/dL
Microalb Creat Ratio: 0.8 mg/g (ref 0.0–30.0)
Microalb, Ur: 1.5 mg/dL (ref 0.0–1.9)

## 2023-11-08 DIAGNOSIS — L400 Psoriasis vulgaris: Secondary | ICD-10-CM | POA: Diagnosis not present

## 2023-11-10 DIAGNOSIS — N1832 Chronic kidney disease, stage 3b: Secondary | ICD-10-CM | POA: Diagnosis not present

## 2023-11-10 DIAGNOSIS — G629 Polyneuropathy, unspecified: Secondary | ICD-10-CM | POA: Diagnosis not present

## 2023-11-10 DIAGNOSIS — E1122 Type 2 diabetes mellitus with diabetic chronic kidney disease: Secondary | ICD-10-CM | POA: Diagnosis not present

## 2023-11-11 DIAGNOSIS — N1832 Chronic kidney disease, stage 3b: Secondary | ICD-10-CM | POA: Diagnosis not present

## 2023-12-16 ENCOUNTER — Other Ambulatory Visit: Payer: Self-pay | Admitting: Internal Medicine

## 2023-12-16 ENCOUNTER — Other Ambulatory Visit: Payer: Self-pay

## 2023-12-31 ENCOUNTER — Other Ambulatory Visit: Payer: Self-pay | Admitting: Internal Medicine

## 2023-12-31 ENCOUNTER — Other Ambulatory Visit: Payer: Self-pay

## 2024-02-02 ENCOUNTER — Ambulatory Visit: Payer: Medicare HMO

## 2024-02-02 VITALS — Ht 72.0 in | Wt 196.0 lb

## 2024-02-02 DIAGNOSIS — Z0000 Encounter for general adult medical examination without abnormal findings: Secondary | ICD-10-CM | POA: Diagnosis not present

## 2024-02-02 NOTE — Patient Instructions (Signed)
 Jesus Arnold , Thank you for taking time to come for your Medicare Wellness Visit. I appreciate your ongoing commitment to your health goals. Please review the following plan we discussed and let me know if I can assist you in the future.   Referrals/Orders/Follow-Ups/Clinician Recommendations: It was nice talking to you today.  You are due for a diabetic eye exam.  This is a list of the screening recommended for you and due dates:  Health Maintenance  Topic Date Due   Zoster (Shingles) Vaccine (1 of 2) Never done   Eye exam for diabetics  07/14/2023   Pneumonia Vaccine (2 of 2 - PPSV23 or PCV20) 02/05/2024*   Hemoglobin A1C  02/06/2024   Yearly kidney function blood test for diabetes  08/08/2024   Yearly kidney health urinalysis for diabetes  08/08/2024   Complete foot exam   08/08/2024   Medicare Annual Wellness Visit  02/01/2025   DTaP/Tdap/Td vaccine (2 - Td or Tdap) 11/18/2027   Flu Shot  Completed   HPV Vaccine  Aged Out   Colon Cancer Screening  Discontinued   COVID-19 Vaccine  Discontinued   Hepatitis C Screening  Discontinued  *Topic was postponed. The date shown is not the original due date.    Advanced directives: (Copy Requested) Please bring a copy of your health care power of attorney and living will to the office to be added to your chart at your convenience.  Next Medicare Annual Wellness Visit scheduled for next year: Yes

## 2024-02-02 NOTE — Progress Notes (Signed)
 Subjective:   Jesus Arnold is a 83 y.o. who presents for a Medicare Wellness preventive visit.  Visit Complete: Virtual I connected with  [name] on 02/02/24 by a audio enabled telemedicine application and verified that I am speaking with the correct person using two identifiers.  Patient Location: Home  Provider Location: Home Office  I discussed the limitations of evaluation and management by telemedicine. The patient expressed understanding and agreed to proceed.  Vital Signs: Because this visit was a virtual/telehealth visit, some criteria may be missing or patient reported. Any vitals not documented were not able to be obtained and vitals that have been documented are patient reported.  VideoDeclined- This patient declined Librarian, academic. Therefore the visit was completed with audio only.  AWV Questionnaire: No: Patient Medicare AWV questionnaire was not completed prior to this visit.  Cardiac Risk Factors include: advanced age (>75men, >79 women);male gender;diabetes mellitus;Other (see comment);dyslipidemia, Risk factor comments: CKD     Objective:    Today's Vitals   02/02/24 1007  Weight: 196 lb (88.9 kg)  Height: 6' (1.829 m)   Body mass index is 26.58 kg/m.     02/02/2024   10:19 AM 03/04/2023    2:38 PM 03/02/2022    1:34 PM 09/12/2019    1:08 PM 06/30/2018    6:03 PM 12/31/2016   12:46 PM  Advanced Directives  Does Patient Have a Medical Advance Directive? No No No No No No  Would patient like information on creating a medical advance directive?  No - Patient declined No - Patient declined No - Patient declined Yes (ED - Information included in AVS) No - Patient declined    Current Medications (verified) Outpatient Encounter Medications as of 02/02/2024  Medication Sig   atorvastatin (LIPITOR) 80 MG tablet TAKE 1 TABLET BY MOUTH EVERY DAY   betamethasone, augmented, (DIPROLENE) 0.05 % lotion Apply topically daily as needed.    blood glucose meter kit and supplies KIT Dispense based on patient and insurance preference. Use up to four times daily as directed. (E11.9).   glucose blood (ONE TOUCH ULTRA TEST) test strip Use one strip per test. Test blood sugars 1-4 times daily as instructed.   Lancets Misc. (ONE TOUCH SURESOFT) MISC Use 1 lancet per test. Test blood sugars 1-4 times per day as instructed.   metFORMIN (GLUCOPHAGE-XR) 500 MG 24 hr tablet TAKE 3 TABLETS BY MOUTH EVERY DAY WITH BREAKFAST   mometasone (ELOCON) 0.1 % cream Apply topically 2 (two) times daily.   Multiple Vitamin (MULTIVITAMIN) tablet Take 1 tablet by mouth daily.   pantoprazole (PROTONIX) 40 MG tablet Take 1 tablet (40 mg total) by mouth daily. (Patient not taking: Reported on 02/03/2022)   No facility-administered encounter medications on file as of 02/02/2024.    Allergies (verified) Patient has no known allergies.   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 No pertinent surgical history. History reviewed. No pertinent family history. Social History   Socioeconomic History   Marital status: Single    Spouse name: Not on file   Number of children: 2   Years of education: 10   Highest education level: Not on file  Occupational History   Occupation: Retired  Tobacco Use   Smoking status: Former    Current packs/day: 0.00    Types: Cigarettes  Quit date: 01/28/2017    Years since quitting: 7.0   Smokeless tobacco: Never  Vaping Use   Vaping status: Never Used  Substance and Sexual Activity   Alcohol use: No   Drug use: No   Sexual activity: Not Currently  Other Topics Concern   Not on file  Social History Narrative   Fun/Hobby: Relax; Watch tv, walking.       Lives alone/2025   Social Drivers of Health   Financial Resource Strain: Low Risk  (02/02/2024)   Overall  Financial Resource Strain (CARDIA)    Difficulty of Paying Living Expenses: Not hard at all  Food Insecurity: No Food Insecurity (02/02/2024)   Hunger Vital Sign    Worried About Running Out of Food in the Last Year: Never true    Ran Out of Food in the Last Year: Never true  Transportation Needs: No Transportation Needs (02/02/2024)   PRAPARE - Administrator, Civil Service (Medical): No    Lack of Transportation (Non-Medical): No  Physical Activity: Sufficiently Active (02/02/2024)   Exercise Vital Sign    Days of Exercise per Week: 4 days    Minutes of Exercise per Session: 60 min  Stress: No Stress Concern Present (02/02/2024)   [employer] of Occupational Health - Occupational Stress Questionnaire    Feeling of Stress : Not at all  Social Connections: Socially Isolated (02/02/2024)   Social Connection and Isolation Panel [NHANES]    Frequency of Communication with Friends and Family: Twice a week    Frequency of Social Gatherings with Friends and Family: Never    Attends Religious Services: Never    Database administrator or Organizations: No    Attends Engineer, structural: Never    Marital Status: Divorced    Tobacco Counseling Counseling given: Not Answered    Clinical Intake:  Pre-visit preparation completed: Yes  Pain : No/denies pain     BMI - recorded: 26.58 Nutritional Status: BMI 25 -29 Overweight Nutritional Risks: None Diabetes: Yes CBG done?: Yes (93-per pt) CBG resulted in Enter/ Edit results?: No Did pt. bring in CBG monitor from home?: No  How often do you need to have someone help you when you read instructions, pamphlets, or other written materials from your doctor or pharmacy?: 1 - Never  Interpreter Needed?: No  Information entered by :: [employer],RMA   Activities of Daily Living     02/02/2024   10:08 AM 03/04/2023    2:51 PM  In your present state of health, do you have any difficulty performing the following  activities:  Hearing? 0 0  Vision? 0 0  Difficulty concentrating or making decisions? 0 0  Walking or climbing stairs? 0 0  Dressing or bathing? 0 0  Doing errands, shopping? 0 0  Comment public transportation   Preparing Food and eating ? N N  Using the Toilet? N N  In the past six months, have you accidently leaked urine? N N  Do you have problems with loss of bowel control? N N  Managing your Medications? N N  Managing your Finances? N N  Housekeeping or managing your Housekeeping? N N    Patient Care Team: Corwin Levins, MD as PCP - General (Internal Medicine) Diona Foley, MD as Consulting Physician (Ophthalmology) Rennis Chris, MD as Consulting Physician (Ophthalmology)  Indicate any recent Medical Services you may have received from other than [facility] providers in the past year (date may be approximate).  Assessment:   This is a routine wellness examination for [place].  Hearing/Vision screen Hearing Screening - Comments:: Denies hearing difficulties   Vision Screening - Comments:: Wears eyeglasses   Goals Addressed             This Visit's Progress    Patient Stated   On track    I want to continue to be happy and peaceful. Stay active and enjoy life. Start to monitor diet for sugar and carbohydrates. Drink more water.       Depression Screen     02/02/2024   10:24 AM 08/09/2023    2:36 PM 03/04/2023    2:49 PM 08/07/2022    3:12 PM 06/30/2022    2:10 PM 03/02/2022    1:37 PM 02/03/2022   10:09 AM  PHQ 2/9 Scores  PHQ - 2 Score 0 0 0  0 0 0  PHQ- 9 Score 0  0  0    Exception Documentation    Patient refusal       Fall Risk     02/02/2024   10:19 AM 08/09/2023    2:36 PM 03/04/2023    2:41 PM 02/05/2023    2:57 PM 08/07/2022    3:12 PM  Fall Risk   Falls in the past year? 0 0 0 0 0  Number falls in past yr: 0 0 0 0   Injury with Fall? 0 0 0 0 0  Risk for fall due to : No Fall Risks No Fall Risks No Fall Risks No Fall Risks No Fall Risks  Follow up Falls  prevention discussed;Falls evaluation completed Falls evaluation completed Falls prevention discussed Falls evaluation completed;Education provided Falls evaluation completed    MEDICARE RISK AT HOME:  Medicare Risk at Home Any stairs in or around the home?: Yes If so, are there any without handrails?: Yes Home free of loose throw rugs in walkways, pet beds, electrical cords, etc?: Yes Adequate lighting in your home to reduce risk of falls?: Yes Life alert?: No Use of a cane, walker or w/c?: Yes Grab bars in the bathroom?: Yes Shower chair or bench in shower?: Yes Elevated toilet seat or a handicapped toilet?: Yes  TIMED UP AND GO:  Was the test performed?  No  Cognitive Function: 6CIT completed    06/30/2018    6:10 PM  MMSE - Mini Mental State Exam  Orientation to time 5  Orientation to Place 5  Registration 3  Attention/ Calculation 5  Recall 2  Language- name 2 objects 2  Language- repeat 1  Language- follow 3 step command 3  Language- read & follow direction 1  Write a sentence 1  Copy design 1  Total score 29        02/02/2024   10:20 AM 03/04/2023    2:42 PM 03/02/2022    1:46 PM  6CIT Screen  What Year? 0 points 0 points 0 points  What month? 0 points 0 points 0 points  What time? 0 points 0 points 0 points  Count back from 20 4 points 0 points 0 points  Months in reverse 4 points 0 points 0 points  Repeat phrase 8 points 0 points 0 points  Total Score 16 points 0 points 0 points    Immunizations Immunization History  Administered Date(s) Administered   Fluad Quad(high Dose 65+) 08/28/2019, 01/27/2021, 08/07/2022   Fluad Trivalent(High Dose 65+) 08/09/2023   Influenza, High Dose Seasonal PF 09/30/2018   Influenza-Unspecified 09/30/2018  PFIZER(Purple Top)SARS-COV-2 Vaccination 05/14/2020, 06/04/2020, 03/05/2021   Pneumococcal Conjugate-13 11/17/2017, 05/09/2020   Tdap 11/17/2017    Screening Tests Health Maintenance  Topic Date Due   Zoster  Vaccines- Shingrix (1 of 2) Never done   OPHTHALMOLOGY EXAM  07/14/2023   Pneumonia Vaccine 48+ Years old (2 of 2 - PPSV23 or PCV20) 02/05/2024 (Originally 07/04/2020)   HEMOGLOBIN A1C  02/06/2024   Diabetic kidney evaluation - eGFR measurement  08/08/2024   Diabetic kidney evaluation - Urine ACR  08/08/2024   FOOT EXAM  08/08/2024   Medicare Annual Wellness (AWV)  02/01/2025   DTaP/Tdap/Td (2 - Td or Tdap) 11/18/2027   INFLUENZA VACCINE  Completed   HPV VACCINES  Aged Out   Colonoscopy  Discontinued   COVID-19 Vaccine  Discontinued   Hepatitis C Screening  Discontinued    Health Maintenance  Health Maintenance Due  Topic Date Due   Zoster Vaccines- Shingrix (1 of 2) Never done   OPHTHALMOLOGY EXAM  07/14/2023   Health Maintenance Items Addressed: See Nurse Notes  Additional Screening:  Vision Screening: Recommended annual ophthalmology exams for early detection of glaucoma and other disorders of the eye.  Dental Screening: Recommended annual dental exams for proper oral hygiene  Community Resource Referral / Chronic Care Management: CRR required this visit?  No   CCM required this visit?  No     Plan:     I have personally reviewed and noted the following in the patient's chart:   Medical and social history Use of alcohol, tobacco or illicit drugs  Current medications and supplements including opioid prescriptions. Patient is not currently taking opioid prescriptions. Functional ability and status Nutritional status Physical activity Advanced directives List of other physicians Hospitalizations, surgeries, and ER visits in previous 12 months Vitals Screenings to include cognitive, depression, and falls Referrals and appointments  In addition, I have reviewed and discussed with patient certain preventive protocols, quality metrics, and best practice recommendations. A written personalized care plan for preventive services as well as general preventive health  recommendations were provided to patient.     Vasilia Dise L Aften Lipsey, CMA   02/02/2024   After Visit Summary: (Mail) Due to this being a telephonic visit, the after visit summary with patients personalized plan was offered to patient via mail   Notes: Please refer to Routing Comments.

## 2024-02-11 ENCOUNTER — Encounter: Payer: Self-pay | Admitting: Internal Medicine

## 2024-02-11 ENCOUNTER — Ambulatory Visit: Payer: Medicare HMO | Admitting: Internal Medicine

## 2024-02-11 VITALS — BP 120/78 | HR 100 | Temp 97.8°F | Ht 72.0 in | Wt 199.0 lb

## 2024-02-11 DIAGNOSIS — E785 Hyperlipidemia, unspecified: Secondary | ICD-10-CM

## 2024-02-11 DIAGNOSIS — E559 Vitamin D deficiency, unspecified: Secondary | ICD-10-CM

## 2024-02-11 DIAGNOSIS — Z0000 Encounter for general adult medical examination without abnormal findings: Secondary | ICD-10-CM | POA: Diagnosis not present

## 2024-02-11 DIAGNOSIS — E538 Deficiency of other specified B group vitamins: Secondary | ICD-10-CM | POA: Diagnosis not present

## 2024-02-11 DIAGNOSIS — E1165 Type 2 diabetes mellitus with hyperglycemia: Secondary | ICD-10-CM

## 2024-02-11 DIAGNOSIS — H3530 Unspecified macular degeneration: Secondary | ICD-10-CM | POA: Diagnosis not present

## 2024-02-11 DIAGNOSIS — Z7984 Long term (current) use of oral hypoglycemic drugs: Secondary | ICD-10-CM

## 2024-02-11 DIAGNOSIS — N1831 Chronic kidney disease, stage 3a: Secondary | ICD-10-CM | POA: Diagnosis not present

## 2024-02-11 DIAGNOSIS — Z0001 Encounter for general adult medical examination with abnormal findings: Secondary | ICD-10-CM

## 2024-02-11 LAB — URINALYSIS, ROUTINE W REFLEX MICROSCOPIC
Bilirubin Urine: NEGATIVE
Hgb urine dipstick: NEGATIVE
Leukocytes,Ua: NEGATIVE
Nitrite: NEGATIVE
RBC / HPF: NONE SEEN (ref 0–?)
Specific Gravity, Urine: >=1.03 — AB (ref 1.000–1.030)
Total Protein, Urine: 30 — AB
Urine Glucose: NEGATIVE
Urobilinogen, UA: 1 (ref 0.0–1.0)
pH: 6 (ref 5.0–8.0)

## 2024-02-11 LAB — BASIC METABOLIC PANEL
BUN: 10 mg/dL (ref 6–23)
CO2: 27 meq/L (ref 19–32)
Calcium: 8.9 mg/dL (ref 8.4–10.5)
Chloride: 107 meq/L (ref 96–112)
Creatinine, Ser: 1.25 mg/dL (ref 0.40–1.50)
GFR: 54.33 mL/min — ABNORMAL LOW (ref >60.00)
Glucose, Bld: 82 mg/dL (ref 70–99)
Potassium: 4 meq/L (ref 3.5–5.1)
Sodium: 141 meq/L (ref 135–145)

## 2024-02-11 LAB — CBC WITH DIFFERENTIAL/PLATELET
Basophils Absolute: 0 10*3/uL (ref 0.0–0.1)
Basophils Relative: 0.7 % (ref 0.0–3.0)
Eosinophils Absolute: 0.2 10*3/uL (ref 0.0–0.7)
Eosinophils Relative: 3.6 % (ref 0.0–5.0)
HCT: 40.2 % (ref 39.0–52.0)
Hemoglobin: 13.3 g/dL (ref 13.0–17.0)
Lymphocytes Relative: 30.5 % (ref 12.0–46.0)
Lymphs Abs: 1.5 10*3/uL (ref 0.7–4.0)
MCHC: 33 g/dL (ref 30.0–36.0)
MCV: 89.1 fl (ref 78.0–100.0)
Monocytes Absolute: 0.5 10*3/uL (ref 0.1–1.0)
Monocytes Relative: 10.9 % (ref 3.0–12.0)
Neutro Abs: 2.7 10*3/uL (ref 1.4–7.7)
Neutrophils Relative %: 54.3 % (ref 43.0–77.0)
Platelets: 243 10*3/uL (ref 150.0–400.0)
RBC: 4.52 Mil/uL (ref 4.22–5.81)
RDW: 16.1 % — ABNORMAL HIGH (ref 11.5–15.5)
WBC: 5 10*3/uL (ref 4.0–10.5)

## 2024-02-11 LAB — HEPATIC FUNCTION PANEL
ALT: 55 U/L — ABNORMAL HIGH (ref 0–53)
AST: 77 U/L — ABNORMAL HIGH (ref 0–37)
Albumin: 3.9 g/dL (ref 3.5–5.2)
Alkaline Phosphatase: 77 U/L (ref 39–117)
Bilirubin, Direct: 0.2 mg/dL (ref 0.0–0.3)
Total Bilirubin: 0.6 mg/dL (ref 0.2–1.2)
Total Protein: 7 g/dL (ref 6.0–8.3)

## 2024-02-11 LAB — LIPID PANEL
Cholesterol: 114 mg/dL (ref 0–200)
HDL: 43.2 mg/dL (ref >39.00)
LDL Cholesterol: 59 mg/dL (ref 0–99)
NonHDL: 71.04
Total CHOL/HDL Ratio: 3
Triglycerides: 58 mg/dL (ref 0.0–149.0)
VLDL: 11.6 mg/dL (ref 0.0–40.0)

## 2024-02-11 LAB — HEMOGLOBIN A1C: Hgb A1c MFr Bld: 6.3 % (ref 4.6–6.5)

## 2024-02-11 LAB — TSH: TSH: 1.24 u[IU]/mL (ref 0.35–5.50)

## 2024-02-11 LAB — MICROALBUMIN / CREATININE URINE RATIO
Creatinine,U: 178.5 mg/dL
Microalb Creat Ratio: 17.8 mg/g (ref 0.0–30.0)
Microalb, Ur: 3.2 mg/dL — ABNORMAL HIGH (ref 0.0–1.9)

## 2024-02-11 LAB — VITAMIN D 25 HYDROXY (VIT D DEFICIENCY, FRACTURES): VITD: 37.92 ng/mL (ref 30.00–100.00)

## 2024-02-11 LAB — VITAMIN B12: Vitamin B-12: 411 pg/mL (ref 211–911)

## 2024-02-11 NOTE — Assessment & Plan Note (Signed)
 Lab Results  Component Value Date   HGBA1C 6.3 02/11/2024   Stable, pt to continue current medical treatment metformin ER 500 mg - 3 qd

## 2024-02-11 NOTE — Patient Instructions (Signed)
 Please continue all other medications as before, and refills have been done if requested.  Please have the pharmacy call with any other refills you may need.  Please continue your efforts at being more active, low cholesterol diet, and weight control.  You are otherwise up to date with prevention measures today.  Please keep your appointments with your specialists as you may have planned  You will be contacted regarding the referral for: eye doctor  Please go to the LAB at the blood drawing area for the tests to be done  You will be contacted by phone if any changes need to be made immediately.  Otherwise, you will receive a letter about your results with an explanation, but please check with MyChart first.  Please make an Appointment to return in 6 months, or sooner if needed

## 2024-02-11 NOTE — Progress Notes (Signed)
 Patient ID: Jesus Arnold, male   DOB: 12-Dec-1940, 83 y.o.   MRN: [id number]         Chief Complaint:: wellness exam and ckd3a, hld, dm, low vit d       HPI:  Jesus Arnold is a 83 y.o. male here for wellness exam; plans to call for eye doctor soon but ok for referral, declines pneumovasx, for shingrix at pharmayc, o/w up to date                        Also Pt denies chest pain, increased sob or doe, wheezing, orthopnea, PND, increased LE swelling, palpitations, dizziness or syncope.  Pt denies polydipsia, polyuria, or new focal neuro s/s.   Pt denies fever, wt loss, night sweats, loss of appetite, or other constitutional symptoms  Pt has not been able to f/u with optho for what sounds like macular degeneration after told he needed shots to the eye.     Wt Readings from Last 3 Encounters:  02/11/24 199 lb (90.3 kg)  02/02/24 196 lb (88.9 kg)  08/09/23 196 lb (88.9 kg)   BP Readings from Last 3 Encounters:  02/11/24 120/78  08/09/23 122/80  02/05/23 120/66   Immunization History  Administered Date(s) Administered   Fluad Quad(high Dose 65+) 08/28/2019, 01/27/2021, 08/07/2022   Fluad Trivalent(High Dose 65+) 08/09/2023   Influenza, High Dose Seasonal PF 09/30/2018   Influenza-Unspecified 09/30/2018   PFIZER(Purple Top)SARS-COV-2 Vaccination 05/14/2020, 06/04/2020, 03/05/2021   Pneumococcal Conjugate-13 11/17/2017, 05/09/2020   Tdap 11/17/2017   Health Maintenance Due  Topic Date Due   Zoster Vaccines- Shingrix (1 of 2) Never done   Pneumonia Vaccine 75+ Years old (2 of 2 - PPSV23 or PCV20) 07/04/2020   OPHTHALMOLOGY EXAM  07/14/2023      Past Medical History:  Diagnosis Date   Diabetes mellitus without complication (HCC)    GERD (gastroesophageal reflux disease)    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 No pertinent surgical history.  reports that he quit smoking about 7 years ago. His smoking use included cigarettes. He has never used smokeless  tobacco. He reports that he does not drink alcohol and does not use drugs. family history is not on file. No Known Allergies Current Outpatient Medications on File Prior to Visit  Medication Sig Dispense Refill   atorvastatin (LIPITOR) 80 MG tablet TAKE 1 TABLET BY MOUTH EVERY DAY 90 tablet 3   betamethasone, augmented, (DIPROLENE) 0.05 % lotion Apply topically daily as needed.     blood glucose meter kit and supplies KIT Dispense based on patient and insurance preference. Use up to four times daily as directed. (E11.9). 1 each 0   glucose blood (ONE TOUCH ULTRA TEST) test strip Use one strip per test. Test blood sugars 1-4 times daily as instructed. 100 each 12   Lancets Misc. (ONE TOUCH SURESOFT) MISC Use 1 lancet per test. Test blood sugars 1-4 times per day as instructed. 1 each 1   metFORMIN (GLUCOPHAGE-XR) 500 MG 24 hr tablet TAKE 3 TABLETS BY MOUTH EVERY DAY WITH BREAKFAST 270 tablet 1   mometasone (ELOCON) 0.1 % cream Apply topically 2 (two) times daily. 45 g 1   Multiple Vitamin (MULTIVITAMIN) tablet Take 1 tablet by mouth daily. 90 tablet 1   pantoprazole (PROTONIX) 40 MG tablet Take 1 tablet (40 mg total) by mouth daily. (Patient not taking: Reported on 02/03/2022) 90 tablet 3   No current facility-administered medications  on file prior to visit.        ROS:  All others reviewed and negative.  Objective        PE:  BP 120/78 (BP Location: Left Arm, Patient Position: Sitting, Cuff Size: Normal)   Pulse 100   Temp 97.8 F (36.6 C) (Oral)   Ht 6' (1.829 m)   Wt 199 lb (90.3 kg)   SpO2 95%   BMI 26.99 kg/m                 Constitutional: Pt appears in NAD               HENT: Head: NCAT.                Right Ear: External ear normal.                 Left Ear: External ear normal.                Eyes: . Pupils are equal, round, and reactive to light. Conjunctivae and EOM are normal               Nose: without d/c or deformity               Neck: Neck supple. Gross normal ROM                Cardiovascular: Normal rate and regular rhythm.                 Pulmonary/Chest: Effort normal and breath sounds without rales or wheezing.                Abd:  Soft, NT, ND, + BS, no organomegaly               Neurological: Pt is alert. At baseline orientation, motor grossly intact               Skin: Skin is warm. No rashes, no other new lesions, LE edema - none               Psychiatric: Pt behavior is normal without agitation   Micro: none  Cardiac tracings I have personally interpreted today:  none  Pertinent Radiological findings (summarize): none   Lab Results  Component Value Date   WBC 5.0 02/11/2024   HGB 13.3 02/11/2024   HCT 40.2 02/11/2024   PLT 243.0 02/11/2024   GLUCOSE 82 02/11/2024   CHOL 114 02/11/2024   TRIG 58.0 02/11/2024   HDL 43.20 02/11/2024   LDLCALC 59 02/11/2024   ALT 55 (H) 02/11/2024   AST 77 (H) 02/11/2024   NA 141 02/11/2024   K 4.0 02/11/2024   CL 107 02/11/2024   CREATININE 1.25 02/11/2024   BUN 10 02/11/2024   CO2 27 02/11/2024   TSH 1.24 02/11/2024   PSA 0.53 06/30/2022   HGBA1C 6.3 02/11/2024   MICROALBUR 3.2 (H) 02/11/2024   Assessment/Plan:  Jesus Arnold is a 83 y.o. Black or African American [2] male with  has a past medical history of Diabetes mellitus without complication (HCC), GERD (gastroesophageal reflux disease), and Neuropathy (11/17/2017).  Encounter for well adult exam with abnormal findings Age and sex appropriate education and counseling updated with regular exercise and diet Referrals for preventative services - for eye referral Immunizations addressed -declines pneumovax, for shingrix at pharmacy Smoking counseling  - none needed Evidence for depression or other mood disorder - none significant Most recent labs reviewed. I have  personally reviewed and have noted: 1) the patient's medical and social history 2) The patient's current medications and supplements 3) The patient's height, weight, and BMI  have been recorded in the chart   CKD (chronic kidney disease) stage 3, GFR 30-59 ml/min (HCC) Lab Results  Component Value Date   CREATININE 1.25 02/11/2024   Stable overall, cont to avoid nephrotoxins   Dyslipidemia Lab Results  Component Value Date   LDLCALC 59 02/11/2024   Stable, pt to continue current statin lipitor 80 qd   Macular degeneration For eye exam referral  Type 2 diabetes mellitus (HCC) Lab Results  Component Value Date   HGBA1C 6.3 02/11/2024   Stable, pt to continue current medical treatment metformin ER 500 mg - 3 qd   Vitamin D deficiency Last vitamin D Lab Results  Component Value Date   VD25OH 37.92 02/11/2024   Low, to start oral replacement  Followup: Return in about 6 months (around 08/13/2024).  Oliver Barre, MD 02/11/2024 10:00 PM [facility]  Primary Care - [facility] Internal Medicine

## 2024-02-11 NOTE — Assessment & Plan Note (Signed)
 Lab Results  Component Value Date   CREATININE 1.25 02/11/2024   Stable overall, cont to avoid nephrotoxins

## 2024-02-11 NOTE — Assessment & Plan Note (Signed)
 Lab Results  Component Value Date   LDLCALC 59 02/11/2024   Stable, pt to continue current statin lipitor 80 qd

## 2024-02-11 NOTE — Assessment & Plan Note (Signed)
 Age and sex appropriate education and counseling updated with regular exercise and diet Referrals for preventative services - for eye referral Immunizations addressed -declines pneumovax, for shingrix at pharmacy Smoking counseling  - none needed Evidence for depression or other mood disorder - none significant Most recent labs reviewed. I have personally reviewed and have noted: 1) the patient's medical and social history 2) The patient's current medications and supplements 3) The patient's height, weight, and BMI have been recorded in the chart

## 2024-02-11 NOTE — Assessment & Plan Note (Signed)
 Last vitamin D Lab Results  Component Value Date   VD25OH 37.92 02/11/2024   Low, to start oral replacement

## 2024-02-11 NOTE — Assessment & Plan Note (Signed)
 For eye exam referral

## 2024-02-12 ENCOUNTER — Encounter: Payer: Self-pay | Admitting: Internal Medicine

## 2024-03-01 DIAGNOSIS — H02403 Unspecified ptosis of bilateral eyelids: Secondary | ICD-10-CM | POA: Diagnosis not present

## 2024-03-01 DIAGNOSIS — H348312 Tributary (branch) retinal vein occlusion, right eye, stable: Secondary | ICD-10-CM | POA: Diagnosis not present

## 2024-03-01 DIAGNOSIS — H35371 Puckering of macula, right eye: Secondary | ICD-10-CM | POA: Diagnosis not present

## 2024-03-01 DIAGNOSIS — H2513 Age-related nuclear cataract, bilateral: Secondary | ICD-10-CM | POA: Diagnosis not present

## 2024-03-01 DIAGNOSIS — H5203 Hypermetropia, bilateral: Secondary | ICD-10-CM | POA: Diagnosis not present

## 2024-03-01 LAB — HM DIABETES EYE EXAM

## 2024-03-31 ENCOUNTER — Telehealth: Payer: Self-pay | Admitting: Internal Medicine

## 2024-03-31 NOTE — Telephone Encounter (Signed)
 Copied from CRM [id number]. Topic: Clinical - Medication Question >> Mar 31, 2024  4:03 PM Aisha D wrote: Reason for CRM: Micci with [facility] is regarding a fax they sent over to the office on 4/24 and 4/26. Micci stated that the patient is new with them and they need the medication for metFORMIN  (GLUCOPHAGE -XR) 500 MG 24 hr tablet approved and faxed to them. Call back number is [phone]

## 2024-04-03 ENCOUNTER — Other Ambulatory Visit: Payer: Self-pay

## 2024-04-03 MED ORDER — METFORMIN HCL ER 500 MG PO TB24: ORAL_TABLET | ORAL | 1 refills | Status: DC

## 2024-04-03 NOTE — Telephone Encounter (Signed)
 Refill has been sent.

## 2024-06-05 ENCOUNTER — Other Ambulatory Visit: Payer: Self-pay | Admitting: Internal Medicine

## 2024-08-14 ENCOUNTER — Ambulatory Visit: Payer: Self-pay | Admitting: Internal Medicine

## 2024-08-14 ENCOUNTER — Ambulatory Visit: Admitting: Internal Medicine

## 2024-08-14 ENCOUNTER — Encounter: Payer: Self-pay | Admitting: Internal Medicine

## 2024-08-14 VITALS — BP 118/72 | HR 104 | Temp 98.1°F | Ht 72.0 in | Wt 207.8 lb

## 2024-08-14 DIAGNOSIS — E785 Hyperlipidemia, unspecified: Secondary | ICD-10-CM | POA: Diagnosis not present

## 2024-08-14 DIAGNOSIS — N1831 Chronic kidney disease, stage 3a: Secondary | ICD-10-CM | POA: Diagnosis not present

## 2024-08-14 DIAGNOSIS — R413 Other amnesia: Secondary | ICD-10-CM | POA: Diagnosis not present

## 2024-08-14 DIAGNOSIS — Z7984 Long term (current) use of oral hypoglycemic drugs: Secondary | ICD-10-CM

## 2024-08-14 DIAGNOSIS — E559 Vitamin D deficiency, unspecified: Secondary | ICD-10-CM | POA: Diagnosis not present

## 2024-08-14 DIAGNOSIS — E1140 Type 2 diabetes mellitus with diabetic neuropathy, unspecified: Secondary | ICD-10-CM

## 2024-08-14 DIAGNOSIS — R269 Unspecified abnormalities of gait and mobility: Secondary | ICD-10-CM

## 2024-08-14 LAB — LIPID PANEL
Cholesterol: 118 mg/dL (ref 0–200)
HDL: 46.2 mg/dL (ref >39.00)
LDL Cholesterol: 62 mg/dL (ref 0–99)
NonHDL: 71.65
Total CHOL/HDL Ratio: 3
Triglycerides: 47 mg/dL (ref 0.0–149.0)
VLDL: 9.4 mg/dL (ref 0.0–40.0)

## 2024-08-14 LAB — BASIC METABOLIC PANEL WITH GFR
BUN: 10 mg/dL (ref 6–23)
CO2: 28 meq/L (ref 19–32)
Calcium: 9.4 mg/dL (ref 8.4–10.5)
Chloride: 103 meq/L (ref 96–112)
Creatinine, Ser: 1.5 mg/dL (ref 0.40–1.50)
GFR: 43.5 mL/min — ABNORMAL LOW (ref >60.00)
Glucose, Bld: 207 mg/dL — ABNORMAL HIGH (ref 70–99)
Potassium: 3.6 meq/L (ref 3.5–5.1)
Sodium: 141 meq/L (ref 135–145)

## 2024-08-14 LAB — HEPATIC FUNCTION PANEL
ALT: 44 U/L (ref 0–53)
AST: 58 U/L — ABNORMAL HIGH (ref 0–37)
Albumin: 4 g/dL (ref 3.5–5.2)
Alkaline Phosphatase: 79 U/L (ref 39–117)
Bilirubin, Direct: 0.2 mg/dL (ref 0.0–0.3)
Total Bilirubin: 0.6 mg/dL (ref 0.2–1.2)
Total Protein: 7.2 g/dL (ref 6.0–8.3)

## 2024-08-14 LAB — VITAMIN D 25 HYDROXY (VIT D DEFICIENCY, FRACTURES): VITD: 53.41 ng/mL (ref 30.00–100.00)

## 2024-08-14 LAB — HEMOGLOBIN A1C: Hgb A1c MFr Bld: 7.1 % — ABNORMAL HIGH (ref 4.6–6.5)

## 2024-08-14 NOTE — Assessment & Plan Note (Signed)
 Lab Results  Component Value Date   HGBA1C 7.1 (H) 08/14/2024   Uncontrolled but ok for age, pt to continue current medical treatment metfomrin ER 500 mg - 3 every day,

## 2024-08-14 NOTE — Patient Instructions (Signed)
 You had the flu shot today  Please have your Shingrix (shingles) shots done at your local pharmacy., as well as the Prevnar 20 pneumonia shot  Please continue all other medications as before, and refills have been done if requested.  Please have the pharmacy call with any other refills you may need.  Please continue your efforts at being more active, low cholesterol diet, and weight control.  Please keep your appointments with your specialists as you may have planned  You will be contacted regarding the referral for: MRI brain, and Physical Therapy  Please go to the LAB at the blood drawing area for the tests to be done  You will be contacted by phone if any changes need to be made immediately.  Otherwise, you will receive a letter about your results with an explanation, but please check with MyChart first.  Please make an Appointment to return in 6 months, or sooner if needed

## 2024-08-14 NOTE — Assessment & Plan Note (Signed)
 With recent mild worsening - for brain MRI, declines neuro for now

## 2024-08-14 NOTE — Assessment & Plan Note (Addendum)
 Worsening , Pt for refer PT

## 2024-08-14 NOTE — Assessment & Plan Note (Signed)
 Last vitamin D  Lab Results  Component Value Date   VD25OH 53.41 08/14/2024   Stable, cont oral replacement

## 2024-08-14 NOTE — Progress Notes (Signed)
 Patient ID: Jesus Arnold, male   DOB: [birth year], 83 y.o.   MRN: [id number]        Chief Complaint: follow up HTN, HLD , DM with neuropathy, gait disorder, memory changes       HPI:  Jesus Arnold is a 83 y.o. male here with c/o stable bilateral lower leg numbness, Pt denies chest pain, increased sob or doe, wheezing, orthopnea, PND, increased LE swelling, palpitations, dizziness or syncope.   Pt denies polydipsia, polyuria, or new focal neuro s/s.   Does have worsening generalized weakness and gait difficulty despite using the cane today  Also mentions several months worsening ST memory loss and changes, but still has quite good LT memory.  Due for flu shot. 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 No pertinent surgical history.  reports that he quit smoking about 7 years ago. His smoking use included cigarettes. He has never used smokeless tobacco. He reports that he does not drink alcohol and does not use drugs. family history is not on file. No Known Allergies Current Outpatient Medications on File Prior to Visit  Medication Sig Dispense Refill   atorvastatin  (LIPITOR) 80 MG tablet TAKE 1 TABLET BY MOUTH EVERY DAY 90 tablet 3   betamethasone , augmented, (DIPROLENE ) 0.05 % lotion Apply topically daily as needed.     blood glucose meter kit and supplies KIT Dispense based on patient and insurance preference. Use up to four times daily as directed. (E11.9). 1 each 0   glucose blood (ONE TOUCH ULTRA TEST) test strip Use one strip per test. Test blood sugars 1-4 times daily as instructed. 100 each 12   Lancets Misc.  (ONE TOUCH SURESOFT) MISC Use 1 lancet per test. Test blood sugars 1-4 times per day as instructed. 1 each 1   metFORMIN  (GLUCOPHAGE -XR) 500 MG 24 hr tablet TAKE 3 TABLETS BY MOUTH EVERY DAY WITH BREAKFAST 270 tablet 1   mometasone  (ELOCON ) 0.1 % cream Apply topically 2 (two) times daily. 45 g 1   Multiple Vitamin (MULTIVITAMIN) tablet Take 1 tablet by mouth daily. 90 tablet 1   pantoprazole  (PROTONIX ) 40 MG tablet Take 1 tablet (40 mg total) by mouth daily. 90 tablet 3   No current facility-administered medications on file prior to visit.        ROS:  All others reviewed and negative.  Objective        PE:  BP 118/72   Pulse (!) 104   Temp 98.1 F (36.7 C)   Ht 6' (1.829 m)   Wt 207 lb 12.8 oz (94.3 kg)   SpO2 99%   BMI 28.18 kg/m                 Constitutional: Pt appears in NAD               HENT: Head: NCAT.                Right Ear: External ear normal.  Left Ear: External ear normal.                Eyes: . Pupils are equal, round, and reactive to light. Conjunctivae and EOM are normal               Nose: without d/c or deformity               Neck: Neck supple. Gross normal ROM               Cardiovascular: Normal rate and regular rhythm.                 Pulmonary/Chest: Effort normal and breath sounds without rales or wheezing.                Abd:  Soft, NT, ND, + BS, no organomegaly               Neurological: Pt is alert. At baseline orientation, motor grossly intact               Skin: Skin is warm. No rashes, no other new lesions, LE edema - none               Psychiatric: Pt behavior is normal without agitation   Micro: none  Cardiac tracings I have personally interpreted today:  none  Pertinent Radiological findings (summarize): none   Lab Results  Component Value Date   WBC 5.0 02/11/2024   HGB 13.3 02/11/2024   HCT 40.2 02/11/2024   PLT 243.0 02/11/2024   GLUCOSE 207 (H) 08/14/2024   CHOL 118 08/14/2024   TRIG 47.0 08/14/2024   HDL  46.20 08/14/2024   LDLCALC 62 08/14/2024   ALT 44 08/14/2024   AST 58 (H) 08/14/2024   NA 141 08/14/2024   K 3.6 08/14/2024   CL 103 08/14/2024   CREATININE 1.50 08/14/2024   BUN 10 08/14/2024   CO2 28 08/14/2024   TSH 1.24 02/11/2024   PSA 0.53 06/30/2022   HGBA1C 7.1 (H) 08/14/2024   MICROALBUR 3.2 (H) 02/11/2024   Assessment/Plan:  Jesus Arnold is a 83 y.o. Black or African American [2] male with  has a past medical history of Diabetes mellitus without complication (HCC), GERD (gastroesophageal reflux disease), and Neuropathy (11/17/2017).  CKD (chronic kidney disease) stage 3, GFR 30-59 ml/min (HCC) Lab Results  Component Value Date   CREATININE 1.50 08/14/2024   Stable overall, cont to avoid nephrotoxins   Dyslipidemia Lab Results  Component Value Date   LDLCALC 62 08/14/2024   Stable, pt to continue current statin lipitor 80 mg qd   Type 2 diabetes mellitus (HCC) Lab Results  Component Value Date   HGBA1C 7.1 (H) 08/14/2024   Uncontrolled but ok for age, pt to continue current medical treatment metfomrin ER 500 mg - 3 every day,    Vitamin D  deficiency Last vitamin D  Lab Results  Component Value Date   VD25OH 53.41 08/14/2024   Stable, cont oral replacement   Gait disorder Worsening , Pt for refer PT  Memory loss With recent mild worsening - for brain MRI, declines neuro for now  Followup: Return in about 6 months (around 02/11/2025).  Jesus Rush, MD 08/14/2024 7:37 PM [facility]

## 2024-08-14 NOTE — Assessment & Plan Note (Signed)
 Lab Results  Component Value Date   LDLCALC 62 08/14/2024   Stable, pt to continue current statin lipitor 80 mg qd

## 2024-08-14 NOTE — Assessment & Plan Note (Signed)
 Lab Results  Component Value Date   CREATININE 1.50 08/14/2024   Stable overall, cont to avoid nephrotoxins

## 2024-08-22 ENCOUNTER — Encounter: Payer: Self-pay | Admitting: Internal Medicine

## 2024-08-31 ENCOUNTER — Ambulatory Visit
Admission: RE | Admit: 2024-08-31 | Discharge: 2024-08-31 | Disposition: A | Discharge status: Home / Self Care | Source: Ambulatory Visit | Attending: Internal Medicine | Admitting: Internal Medicine

## 2024-08-31 ENCOUNTER — Other Ambulatory Visit

## 2024-08-31 DIAGNOSIS — R413 Other amnesia: Secondary | ICD-10-CM

## 2024-08-31 DIAGNOSIS — G9389 Other specified disorders of brain: Secondary | ICD-10-CM | POA: Diagnosis not present

## 2024-09-03 ENCOUNTER — Other Ambulatory Visit: Payer: Self-pay | Admitting: Internal Medicine

## 2024-09-03 ENCOUNTER — Encounter: Payer: Self-pay | Admitting: Internal Medicine

## 2024-09-03 DIAGNOSIS — K119 Disease of salivary gland, unspecified: Secondary | ICD-10-CM

## 2024-09-03 NOTE — Progress Notes (Signed)
 Letter sent, cont same tx except    The test results show that your current treatment is OK, as the MRI did not show tumor.  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  Also please let me know if you would want the neurology referral as mentioned at your visit.  Otherwise.  Please continue the same plan.  Staff to please inform pt, I will do referral, and let me know if he would want to see Neurology as he wasn't so inclined at the visit.

## 2024-09-04 ENCOUNTER — Telehealth: Payer: Self-pay

## 2024-09-04 NOTE — Telephone Encounter (Signed)
 SABRA

## 2024-09-04 NOTE — Telephone Encounter (Signed)
 Copied from CRM [id number]. Topic: General - Other >> Sep 04, 2024  9:29 AM Cleave MATSU wrote: Reason for CRM: diane from [facility] radiology called about the head report from October 2nd and want dr joshua to pay attention to number 8.

## 2024-10-20 ENCOUNTER — Encounter (INDEPENDENT_AMBULATORY_CARE_PROVIDER_SITE_OTHER): Payer: Self-pay

## 2024-11-07 DIAGNOSIS — L400 Psoriasis vulgaris: Secondary | ICD-10-CM | POA: Diagnosis not present

## 2024-12-27 ENCOUNTER — Other Ambulatory Visit: Payer: Self-pay

## 2024-12-27 ENCOUNTER — Other Ambulatory Visit: Payer: Self-pay | Admitting: Internal Medicine

## 2025-02-02 ENCOUNTER — Ambulatory Visit

## 2025-02-12 ENCOUNTER — Ambulatory Visit: Admitting: Internal Medicine
